# Patient Record
Sex: Male | Born: 1945 | Race: White | Hispanic: No | Marital: Married | Smoking: Former smoker
Health system: Southern US, Community
[De-identification: ages and names within clinical notes are randomized; demographics above are authoritative.]

## PROBLEM LIST (undated history)

## (undated) DIAGNOSIS — I50813 Acute on chronic right heart failure: Secondary | ICD-10-CM

## (undated) DIAGNOSIS — J449 Chronic obstructive pulmonary disease, unspecified: Secondary | ICD-10-CM

## (undated) DIAGNOSIS — R0682 Tachypnea, not elsewhere classified: Secondary | ICD-10-CM

## (undated) DIAGNOSIS — I2510 Atherosclerotic heart disease of native coronary artery without angina pectoris: Secondary | ICD-10-CM

## (undated) DIAGNOSIS — E559 Vitamin D deficiency, unspecified: Secondary | ICD-10-CM

## (undated) DIAGNOSIS — J301 Allergic rhinitis due to pollen: Secondary | ICD-10-CM

## (undated) DIAGNOSIS — I4891 Unspecified atrial fibrillation: Secondary | ICD-10-CM

## (undated) DIAGNOSIS — I255 Ischemic cardiomyopathy: Secondary | ICD-10-CM

## (undated) DIAGNOSIS — D638 Anemia in other chronic diseases classified elsewhere: Secondary | ICD-10-CM

---

## 2020-09-17 ENCOUNTER — Emergency Department (HOSPITAL_COMMUNITY): Payer: Medicare Other

## 2020-09-17 ENCOUNTER — Other Ambulatory Visit: Payer: Self-pay

## 2020-09-17 ENCOUNTER — Inpatient Hospital Stay (HOSPITAL_COMMUNITY)
Admission: EM | Admit: 2020-09-17 | Discharge: 2020-10-01 | DRG: 291 | Disposition: A | Discharge status: Hospice | Payer: Medicare Other | Attending: Internal Medicine | Admitting: Internal Medicine

## 2020-09-17 ENCOUNTER — Encounter (HOSPITAL_COMMUNITY): Payer: Self-pay | Admitting: Emergency Medicine

## 2020-09-17 DIAGNOSIS — E876 Hypokalemia: Secondary | ICD-10-CM | POA: Diagnosis not present

## 2020-09-17 DIAGNOSIS — J9621 Acute and chronic respiratory failure with hypoxia: Secondary | ICD-10-CM | POA: Diagnosis present

## 2020-09-17 DIAGNOSIS — I4819 Other persistent atrial fibrillation: Secondary | ICD-10-CM | POA: Diagnosis present

## 2020-09-17 DIAGNOSIS — R778 Other specified abnormalities of plasma proteins: Secondary | ICD-10-CM | POA: Diagnosis not present

## 2020-09-17 DIAGNOSIS — I248 Other forms of acute ischemic heart disease: Secondary | ICD-10-CM | POA: Diagnosis present

## 2020-09-17 DIAGNOSIS — I2510 Atherosclerotic heart disease of native coronary artery without angina pectoris: Secondary | ICD-10-CM | POA: Diagnosis present

## 2020-09-17 DIAGNOSIS — I130 Hypertensive heart and chronic kidney disease with heart failure and stage 1 through stage 4 chronic kidney disease, or unspecified chronic kidney disease: Secondary | ICD-10-CM | POA: Diagnosis present

## 2020-09-17 DIAGNOSIS — R0602 Shortness of breath: Secondary | ICD-10-CM | POA: Diagnosis not present

## 2020-09-17 DIAGNOSIS — E1122 Type 2 diabetes mellitus with diabetic chronic kidney disease: Secondary | ICD-10-CM | POA: Diagnosis present

## 2020-09-17 DIAGNOSIS — I5043 Acute on chronic combined systolic (congestive) and diastolic (congestive) heart failure: Secondary | ICD-10-CM | POA: Diagnosis present

## 2020-09-17 DIAGNOSIS — Z9981 Dependence on supplemental oxygen: Secondary | ICD-10-CM

## 2020-09-17 DIAGNOSIS — J9611 Chronic respiratory failure with hypoxia: Secondary | ICD-10-CM | POA: Diagnosis not present

## 2020-09-17 DIAGNOSIS — J189 Pneumonia, unspecified organism: Secondary | ICD-10-CM | POA: Diagnosis present

## 2020-09-17 DIAGNOSIS — N179 Acute kidney failure, unspecified: Secondary | ICD-10-CM | POA: Diagnosis present

## 2020-09-17 DIAGNOSIS — Z955 Presence of coronary angioplasty implant and graft: Secondary | ICD-10-CM

## 2020-09-17 DIAGNOSIS — I480 Paroxysmal atrial fibrillation: Secondary | ICD-10-CM | POA: Diagnosis not present

## 2020-09-17 DIAGNOSIS — J441 Chronic obstructive pulmonary disease with (acute) exacerbation: Secondary | ICD-10-CM | POA: Diagnosis present

## 2020-09-17 DIAGNOSIS — E559 Vitamin D deficiency, unspecified: Secondary | ICD-10-CM | POA: Diagnosis present

## 2020-09-17 DIAGNOSIS — Z87891 Personal history of nicotine dependence: Secondary | ICD-10-CM

## 2020-09-17 DIAGNOSIS — R059 Cough, unspecified: Secondary | ICD-10-CM | POA: Diagnosis present

## 2020-09-17 DIAGNOSIS — K649 Unspecified hemorrhoids: Secondary | ICD-10-CM | POA: Diagnosis present

## 2020-09-17 DIAGNOSIS — E785 Hyperlipidemia, unspecified: Secondary | ICD-10-CM | POA: Diagnosis present

## 2020-09-17 DIAGNOSIS — Z20822 Contact with and (suspected) exposure to covid-19: Secondary | ICD-10-CM | POA: Diagnosis present

## 2020-09-17 DIAGNOSIS — I4891 Unspecified atrial fibrillation: Secondary | ICD-10-CM

## 2020-09-17 DIAGNOSIS — Z7901 Long term (current) use of anticoagulants: Secondary | ICD-10-CM

## 2020-09-17 DIAGNOSIS — Z7189 Other specified counseling: Secondary | ICD-10-CM | POA: Diagnosis not present

## 2020-09-17 DIAGNOSIS — E1165 Type 2 diabetes mellitus with hyperglycemia: Secondary | ICD-10-CM | POA: Diagnosis present

## 2020-09-17 DIAGNOSIS — Z79899 Other long term (current) drug therapy: Secondary | ICD-10-CM

## 2020-09-17 DIAGNOSIS — Z6836 Body mass index (BMI) 36.0-36.9, adult: Secondary | ICD-10-CM | POA: Diagnosis not present

## 2020-09-17 DIAGNOSIS — I472 Ventricular tachycardia: Secondary | ICD-10-CM | POA: Diagnosis present

## 2020-09-17 DIAGNOSIS — I4510 Unspecified right bundle-branch block: Secondary | ICD-10-CM | POA: Diagnosis present

## 2020-09-17 DIAGNOSIS — J440 Chronic obstructive pulmonary disease with acute lower respiratory infection: Secondary | ICD-10-CM | POA: Diagnosis present

## 2020-09-17 DIAGNOSIS — Z8249 Family history of ischemic heart disease and other diseases of the circulatory system: Secondary | ICD-10-CM

## 2020-09-17 DIAGNOSIS — N1831 Chronic kidney disease, stage 3a: Secondary | ICD-10-CM | POA: Diagnosis present

## 2020-09-17 DIAGNOSIS — I5082 Biventricular heart failure: Secondary | ICD-10-CM | POA: Diagnosis present

## 2020-09-17 DIAGNOSIS — K219 Gastro-esophageal reflux disease without esophagitis: Secondary | ICD-10-CM | POA: Diagnosis present

## 2020-09-17 DIAGNOSIS — I10 Essential (primary) hypertension: Secondary | ICD-10-CM | POA: Diagnosis not present

## 2020-09-17 DIAGNOSIS — R339 Retention of urine, unspecified: Secondary | ICD-10-CM | POA: Diagnosis present

## 2020-09-17 DIAGNOSIS — N3289 Other specified disorders of bladder: Secondary | ICD-10-CM | POA: Diagnosis present

## 2020-09-17 DIAGNOSIS — Z7984 Long term (current) use of oral hypoglycemic drugs: Secondary | ICD-10-CM

## 2020-09-17 DIAGNOSIS — Z7951 Long term (current) use of inhaled steroids: Secondary | ICD-10-CM

## 2020-09-17 DIAGNOSIS — I5023 Acute on chronic systolic (congestive) heart failure: Secondary | ICD-10-CM | POA: Diagnosis not present

## 2020-09-17 DIAGNOSIS — R29810 Facial weakness: Secondary | ICD-10-CM | POA: Diagnosis not present

## 2020-09-17 DIAGNOSIS — Z66 Do not resuscitate: Secondary | ICD-10-CM | POA: Diagnosis present

## 2020-09-17 DIAGNOSIS — G4733 Obstructive sleep apnea (adult) (pediatric): Secondary | ICD-10-CM | POA: Diagnosis present

## 2020-09-17 DIAGNOSIS — K5900 Constipation, unspecified: Secondary | ICD-10-CM | POA: Diagnosis present

## 2020-09-17 DIAGNOSIS — I509 Heart failure, unspecified: Secondary | ICD-10-CM

## 2020-09-17 DIAGNOSIS — Z7902 Long term (current) use of antithrombotics/antiplatelets: Secondary | ICD-10-CM

## 2020-09-17 DIAGNOSIS — T380X5A Adverse effect of glucocorticoids and synthetic analogues, initial encounter: Secondary | ICD-10-CM | POA: Diagnosis present

## 2020-09-17 DIAGNOSIS — Z885 Allergy status to narcotic agent status: Secondary | ICD-10-CM

## 2020-09-17 DIAGNOSIS — Z7982 Long term (current) use of aspirin: Secondary | ICD-10-CM

## 2020-09-17 DIAGNOSIS — I5033 Acute on chronic diastolic (congestive) heart failure: Secondary | ICD-10-CM | POA: Diagnosis not present

## 2020-09-17 DIAGNOSIS — Z888 Allergy status to other drugs, medicaments and biological substances status: Secondary | ICD-10-CM

## 2020-09-17 DIAGNOSIS — Z515 Encounter for palliative care: Secondary | ICD-10-CM | POA: Diagnosis not present

## 2020-09-17 HISTORY — DX: Tachypnea, not elsewhere classified: R06.82

## 2020-09-17 HISTORY — DX: Ischemic cardiomyopathy: I25.5

## 2020-09-17 HISTORY — DX: Chronic obstructive pulmonary disease, unspecified: J44.9

## 2020-09-17 HISTORY — DX: Unspecified atrial fibrillation: I48.91

## 2020-09-17 HISTORY — DX: Atherosclerotic heart disease of native coronary artery without angina pectoris: I25.10

## 2020-09-17 HISTORY — DX: Allergic rhinitis due to pollen: J30.1

## 2020-09-17 HISTORY — DX: Acute on chronic right heart failure: I50.813

## 2020-09-17 HISTORY — DX: Vitamin D deficiency, unspecified: E55.9

## 2020-09-17 HISTORY — DX: Anemia in other chronic diseases classified elsewhere: D63.8

## 2020-09-17 LAB — COMPREHENSIVE METABOLIC PANEL
ALT: 23 U/L (ref 0–44)
AST: 37 U/L (ref 15–41)
Albumin: 3.8 g/dL (ref 3.5–5.0)
Alkaline Phosphatase: 67 U/L (ref 38–126)
Anion gap: 9 (ref 5–15)
BUN: 25 mg/dL — ABNORMAL HIGH (ref 8–23)
CO2: 28 mmol/L (ref 22–32)
Calcium: 8.2 mg/dL — ABNORMAL LOW (ref 8.9–10.3)
Chloride: 97 mmol/L — ABNORMAL LOW (ref 98–111)
Creatinine, Ser: 1.21 mg/dL (ref 0.61–1.24)
GFR, Estimated: >60 mL/min (ref >60)
Glucose, Bld: 132 mg/dL — ABNORMAL HIGH (ref 70–99)
Potassium: 4.5 mmol/L (ref 3.5–5.1)
Sodium: 134 mmol/L — ABNORMAL LOW (ref 135–145)
Total Bilirubin: 1.5 mg/dL — ABNORMAL HIGH (ref 0.3–1.2)
Total Protein: 7.3 g/dL (ref 6.5–8.1)

## 2020-09-17 LAB — CBC WITH DIFFERENTIAL/PLATELET
Abs Immature Granulocytes: 0.05 10*3/uL (ref 0.00–0.07)
Basophils Absolute: 0.1 10*3/uL (ref 0.0–0.1)
Basophils Relative: 1 %
Eosinophils Absolute: 0.1 10*3/uL (ref 0.0–0.5)
Eosinophils Relative: 1 %
HCT: 39.2 % (ref 39.0–52.0)
Hemoglobin: 12.5 g/dL — ABNORMAL LOW (ref 13.0–17.0)
Immature Granulocytes: 0 %
Lymphocytes Relative: 22 %
Lymphs Abs: 3.3 10*3/uL (ref 0.7–4.0)
MCH: 28.7 pg (ref 26.0–34.0)
MCHC: 31.9 g/dL (ref 30.0–36.0)
MCV: 89.9 fL (ref 80.0–100.0)
Monocytes Absolute: 1.3 10*3/uL — ABNORMAL HIGH (ref 0.1–1.0)
Monocytes Relative: 9 %
Neutro Abs: 9.9 10*3/uL — ABNORMAL HIGH (ref 1.7–7.7)
Neutrophils Relative %: 67 %
Platelets: 350 10*3/uL (ref 150–400)
RBC: 4.36 MIL/uL (ref 4.22–5.81)
RDW: 14 % (ref 11.5–15.5)
WBC: 14.8 10*3/uL — ABNORMAL HIGH (ref 4.0–10.5)
nRBC: 0 % (ref 0.0–0.2)

## 2020-09-17 LAB — RESP PANEL BY RT-PCR (FLU A&B, COVID) ARPGX2
Influenza A by PCR: NEGATIVE
Influenza B by PCR: NEGATIVE
SARS Coronavirus 2 by RT PCR: NEGATIVE

## 2020-09-17 LAB — LACTIC ACID, PLASMA
Lactic Acid, Venous: 1.6 mmol/L (ref 0.5–1.9)
Lactic Acid, Venous: 1.3 mmol/L (ref 0.5–1.9)

## 2020-09-17 LAB — PROTIME-INR
INR: 1.1 (ref 0.8–1.2)
Prothrombin Time: 14.6 seconds (ref 11.4–15.2)

## 2020-09-17 LAB — MAGNESIUM: Magnesium: 1.2 mg/dL — ABNORMAL LOW (ref 1.7–2.4)

## 2020-09-17 LAB — HEMOGLOBIN A1C
Hgb A1c MFr Bld: 6.3 % — ABNORMAL HIGH (ref 4.8–5.6)
Mean Plasma Glucose: 134.11 mg/dL

## 2020-09-17 LAB — TROPONIN I (HIGH SENSITIVITY)
Troponin I (High Sensitivity): 35 ng/L — ABNORMAL HIGH (ref <18)
Troponin I (High Sensitivity): 31 ng/L — ABNORMAL HIGH (ref <18)

## 2020-09-17 LAB — CBG MONITORING, ED
Glucose-Capillary: 129 mg/dL — ABNORMAL HIGH (ref 70–99)
Glucose-Capillary: 155 mg/dL — ABNORMAL HIGH (ref 70–99)

## 2020-09-17 LAB — BRAIN NATRIURETIC PEPTIDE: B Natriuretic Peptide: 445 pg/mL — ABNORMAL HIGH (ref 0.0–100.0)

## 2020-09-17 MED ORDER — IPRATROPIUM-ALBUTEROL 0.5-2.5 (3) MG/3ML IN SOLN
3.0000 mL | Freq: Four times a day (QID) | RESPIRATORY_TRACT | Status: DC
  Administered 2020-09-17 – 2020-09-26 (×35): 3 mL via RESPIRATORY_TRACT
  Filled 2020-09-17 (×34): qty 3

## 2020-09-17 MED ORDER — IPRATROPIUM-ALBUTEROL 0.5-2.5 (3) MG/3ML IN SOLN
3.0000 mL | RESPIRATORY_TRACT | Status: DC | PRN
  Administered 2020-09-21: 3 mL via RESPIRATORY_TRACT
  Filled 2020-09-17 (×2): qty 3

## 2020-09-17 MED ORDER — DILTIAZEM LOAD VIA INFUSION
10.0000 mg | Freq: Once | INTRAVENOUS | Status: AC
  Administered 2020-09-17: 10 mg via INTRAVENOUS
  Filled 2020-09-17: qty 10

## 2020-09-17 MED ORDER — ATORVASTATIN CALCIUM 40 MG PO TABS
40.0000 mg | ORAL_TABLET | Freq: Every day | ORAL | Status: DC
  Administered 2020-09-18 – 2020-10-01 (×14): 40 mg via ORAL
  Filled 2020-09-17 (×14): qty 1

## 2020-09-17 MED ORDER — PANTOPRAZOLE SODIUM 40 MG PO TBEC
40.0000 mg | DELAYED_RELEASE_TABLET | Freq: Every day | ORAL | Status: DC
  Administered 2020-09-18 – 2020-10-01 (×14): 40 mg via ORAL
  Filled 2020-09-17 (×15): qty 1

## 2020-09-17 MED ORDER — IPRATROPIUM-ALBUTEROL 20-100 MCG/ACT IN AERS: 1.0000 | INHALATION_SPRAY | Freq: Four times a day (QID) | RESPIRATORY_TRACT | Status: DC

## 2020-09-17 MED ORDER — CLOPIDOGREL BISULFATE 75 MG PO TABS
75.0000 mg | ORAL_TABLET | Freq: Every day | ORAL | Status: DC
  Administered 2020-09-18 – 2020-10-01 (×14): 75 mg via ORAL
  Filled 2020-09-17 (×14): qty 1

## 2020-09-17 MED ORDER — ADULT MULTIVITAMIN W/MINERALS CH
1.0000 | ORAL_TABLET | Freq: Every day | ORAL | Status: DC
  Administered 2020-09-18 – 2020-10-01 (×14): 1 via ORAL
  Filled 2020-09-17 (×14): qty 1

## 2020-09-17 MED ORDER — ACETAMINOPHEN 325 MG PO TABS
650.0000 mg | ORAL_TABLET | Freq: Four times a day (QID) | ORAL | Status: DC | PRN
  Administered 2020-09-20 – 2020-10-01 (×3): 650 mg via ORAL
  Filled 2020-09-17 (×4): qty 2

## 2020-09-17 MED ORDER — METOPROLOL TARTRATE 5 MG/5ML IV SOLN
5.0000 mg | Freq: Once | INTRAVENOUS | Status: AC
  Filled 2020-09-17: qty 5

## 2020-09-17 MED ORDER — INSULIN ASPART 100 UNIT/ML IJ SOLN
0.0000 [IU] | Freq: Three times a day (TID) | INTRAMUSCULAR | Status: DC
  Administered 2020-09-18 (×3): 3 [IU] via SUBCUTANEOUS
  Administered 2020-09-19: 2 [IU] via SUBCUTANEOUS
  Administered 2020-09-19 (×2): 3 [IU] via SUBCUTANEOUS
  Administered 2020-09-20: 5 [IU] via SUBCUTANEOUS
  Administered 2020-09-20: 3 [IU] via SUBCUTANEOUS
  Administered 2020-09-20 – 2020-09-21 (×2): 2 [IU] via SUBCUTANEOUS
  Administered 2020-09-21: 3 [IU] via SUBCUTANEOUS
  Administered 2020-09-21: 2 [IU] via SUBCUTANEOUS
  Administered 2020-09-22: 5 [IU] via SUBCUTANEOUS
  Administered 2020-09-22 – 2020-09-23 (×2): 2 [IU] via SUBCUTANEOUS
  Administered 2020-09-23 (×2): 3 [IU] via SUBCUTANEOUS
  Administered 2020-09-24: 2 [IU] via SUBCUTANEOUS
  Administered 2020-09-24: 5 [IU] via SUBCUTANEOUS
  Administered 2020-09-24: 3 [IU] via SUBCUTANEOUS
  Administered 2020-09-25: 8 [IU] via SUBCUTANEOUS
  Administered 2020-09-26 – 2020-09-27 (×3): 3 [IU] via SUBCUTANEOUS
  Administered 2020-09-27 – 2020-09-29 (×5): 2 [IU] via SUBCUTANEOUS
  Administered 2020-09-30: 3 [IU] via SUBCUTANEOUS
  Administered 2020-10-01 (×2): 2 [IU] via SUBCUTANEOUS

## 2020-09-17 MED ORDER — ASPIRIN EC 81 MG PO TBEC
81.0000 mg | DELAYED_RELEASE_TABLET | Freq: Every day | ORAL | Status: DC
  Administered 2020-09-18: 81 mg via ORAL
  Filled 2020-09-17: qty 1

## 2020-09-17 MED ORDER — GABAPENTIN 300 MG PO CAPS
300.0000 mg | ORAL_CAPSULE | Freq: Three times a day (TID) | ORAL | Status: DC
  Administered 2020-09-17 – 2020-10-01 (×43): 300 mg via ORAL
  Filled 2020-09-17 (×43): qty 1

## 2020-09-17 MED ORDER — LEVOFLOXACIN IN D5W 500 MG/100ML IV SOLN
500.0000 mg | Freq: Once | INTRAVENOUS | Status: AC
  Administered 2020-09-17: 500 mg via INTRAVENOUS
  Filled 2020-09-17: qty 100

## 2020-09-17 MED ORDER — METOPROLOL TARTRATE 5 MG/5ML IV SOLN
INTRAVENOUS | Status: AC
  Administered 2020-09-17: 5 mg via INTRAVENOUS
  Filled 2020-09-17: qty 5

## 2020-09-17 MED ORDER — VITAMIN D 25 MCG (1000 UNIT) PO TABS
2000.0000 [IU] | ORAL_TABLET | Freq: Every day | ORAL | Status: DC
  Administered 2020-09-18 – 2020-10-01 (×14): 2000 [IU] via ORAL
  Filled 2020-09-17 (×14): qty 2

## 2020-09-17 MED ORDER — ENOXAPARIN SODIUM 40 MG/0.4ML IJ SOSY
40.0000 mg | PREFILLED_SYRINGE | INTRAMUSCULAR | Status: DC
  Administered 2020-09-17: 40 mg via SUBCUTANEOUS
  Filled 2020-09-17: qty 0.4

## 2020-09-17 MED ORDER — METHYLPREDNISOLONE SODIUM SUCC 125 MG IJ SOLR
125.0000 mg | Freq: Once | INTRAMUSCULAR | Status: AC
  Administered 2020-09-17: 125 mg via INTRAVENOUS
  Filled 2020-09-17: qty 2

## 2020-09-17 MED ORDER — FUROSEMIDE 10 MG/ML IJ SOLN
40.0000 mg | Freq: Two times a day (BID) | INTRAMUSCULAR | Status: DC
  Administered 2020-09-17 – 2020-09-19 (×4): 40 mg via INTRAVENOUS
  Filled 2020-09-17 (×5): qty 4

## 2020-09-17 MED ORDER — MOMETASONE FURO-FORMOTEROL FUM 200-5 MCG/ACT IN AERO
2.0000 | INHALATION_SPRAY | Freq: Two times a day (BID) | RESPIRATORY_TRACT | Status: DC
  Administered 2020-09-17 – 2020-09-26 (×16): 2 via RESPIRATORY_TRACT
  Filled 2020-09-17 (×2): qty 8.8

## 2020-09-17 MED ORDER — SODIUM CHLORIDE 0.9 % IV BOLUS
500.0000 mL | Freq: Once | INTRAVENOUS | Status: AC
Start: 2020-09-17 — End: 2020-09-17
  Administered 2020-09-17: 500 mL via INTRAVENOUS

## 2020-09-17 MED ORDER — NITROGLYCERIN 0.4 MG SL SUBL: 0.4000 mg | SUBLINGUAL_TABLET | SUBLINGUAL | Status: DC | PRN

## 2020-09-17 MED ORDER — DILTIAZEM HCL 25 MG/5ML IV SOLN
10.0000 mg | Freq: Once | INTRAVENOUS | Status: AC
  Administered 2020-09-17: 10 mg via INTRAVENOUS

## 2020-09-17 MED ORDER — DILTIAZEM HCL-DEXTROSE 125-5 MG/125ML-% IV SOLN (PREMIX)
5.0000 mg/h | INTRAVENOUS | Status: DC
  Administered 2020-09-17: 12.5 mg/h via INTRAVENOUS
  Administered 2020-09-17: 5 mg/h via INTRAVENOUS
  Administered 2020-09-18: 12.5 mg/h via INTRAVENOUS
  Filled 2020-09-17 (×4): qty 125

## 2020-09-17 MED ORDER — MAGNESIUM SULFATE 2 GM/50ML IV SOLN
2.0000 g | Freq: Once | INTRAVENOUS | Status: AC
  Administered 2020-09-17: 2 g via INTRAVENOUS
  Filled 2020-09-17: qty 50

## 2020-09-17 MED ORDER — INSULIN ASPART 100 UNIT/ML IJ SOLN
0.0000 [IU] | Freq: Every day | INTRAMUSCULAR | Status: DC
  Administered 2020-09-24: 2 [IU] via SUBCUTANEOUS

## 2020-09-17 MED ORDER — DORZOLAMIDE HCL 2 % OP SOLN
1.0000 [drp] | Freq: Two times a day (BID) | OPHTHALMIC | Status: DC
  Administered 2020-09-18 – 2020-10-01 (×28): 1 [drp] via OPHTHALMIC
  Filled 2020-09-17 (×2): qty 10

## 2020-09-17 MED ORDER — ACETAMINOPHEN 650 MG RE SUPP: 650.0000 mg | Freq: Four times a day (QID) | RECTAL | Status: DC | PRN

## 2020-09-17 MED ORDER — METOPROLOL TARTRATE 25 MG PO TABS
25.0000 mg | ORAL_TABLET | Freq: Two times a day (BID) | ORAL | Status: DC
  Administered 2020-09-18: 25 mg via ORAL
  Filled 2020-09-17: qty 1

## 2020-09-17 MED ORDER — METHYLPREDNISOLONE SODIUM SUCC 40 MG IJ SOLR
40.0000 mg | Freq: Every day | INTRAMUSCULAR | Status: DC
  Administered 2020-09-18 – 2020-09-25 (×8): 40 mg via INTRAVENOUS
  Filled 2020-09-17 (×8): qty 1

## 2020-09-17 NOTE — ED Triage Notes (Signed)
Pt arrived by [facility]. Pt is from home. Pt c/o of feeling SOB> pt has Afib w/ RVR noted in traige. Berle Mull PA at bedside for [facility]

## 2020-09-17 NOTE — H&P (Signed)
History and Physical    Shahin Knierim BTD:[id number] DOB: [birth year] DOA: 09/17/2020  PCP: Smith Robert, MD  Patient coming from: Home  Chief Complaint: SOB  HPI: Dalton Juarez is a 75 y.o. male with medical history significant of copd on chronic 2L [place], DM2, chf with ischemic cardiomyopathy, afib who presented to the ED from home with 2-3 weeks of progressively worsening sob, swelling, and 10lb wt gain. Pt states being adherent to his medicaitons. States gradually had worsened tolerance to even walk few feet without becoming sob and has resorted to using his CPAP during the day to breathe. On the night prior to admit, pt reportedly slept with cpap disconnected. Pt woke up sob and despite increased O2 usage, continued to be sob, thus presented to ED for further w/u. Denies fevers, chills or sweats.  ED Course: In the ED, pt was noted to be tachycardic, found to be in Afib RVR on EKG. Pt was started on cardizem gtt. Pt was also found to have bilateral patchy opacities on CXR and was started on levaquin empirically. Hospitalist consulted for consideration for admission  Review of Systems:  Review of Systems  Constitutional:  Negative for chills, fever and weight loss.  HENT:  Negative for ear pain, nosebleeds and tinnitus.   Eyes:  Negative for double vision, photophobia and discharge.  Respiratory:  Positive for cough and shortness of breath. Negative for hemoptysis.   Cardiovascular:  Positive for orthopnea and leg swelling. Negative for chest pain.  Gastrointestinal:  Negative for abdominal pain, constipation, diarrhea and vomiting.  Genitourinary:  Negative for frequency, hematuria and urgency.  Musculoskeletal:  Negative for back pain, joint pain and neck pain.  Neurological:  Negative for tremors, sensory change, seizures and loss of consciousness.  Psychiatric/Behavioral:  Negative for hallucinations and memory loss.    Past Medical History:  Diagnosis Date   Acute on chronic  right-sided congestive heart failure (HCC)    Anemia in other chronic diseases classified elsewhere    Atrial fibrillation (HCC)    CAD (coronary artery disease)    COPD (chronic obstructive pulmonary disease) (HCC)    Ischemic cardiomyopathy    Seasonal allergic rhinitis due to pollen    Tachypnea    Vitamin D deficiency     History reviewed. No pertinent surgical history.   reports that he has quit smoking. His smoking use included cigarettes. He does not have any smokeless tobacco history on file. He reports that he does not currently use alcohol. He reports that he does not use drugs.  Allergies  Allergen Reactions   Advair Hfa [Fluticasone-Salmeterol]    Amiodarone Other (See Comments)    Bradycardia   Anoro Ellipta [Umeclidinium-Vilanterol]    Breo Ellipta [Fluticasone Furoate-Vilanterol]    Brovana [Arformoterol]    Budesonide    Entresto [Sacubitril-Valsartan]    Oxycodone Other (See Comments)    Required Narcan gtt after 10 of oxycodone for obtundation   Spiriva Respimat [Tiotropium Bromide Monohydrate]    Family hx Pt reports hx of leukemia in family Sister with "rare" kidney disease  Prior to Admission medications   Medication Sig Start Date End Date Taking? Authorizing Provider  albuterol (PROVENTIL) (2.5 MG/3ML) 0.083% nebulizer solution Take 2.5 mg by nebulization 3 (three) times daily. 07/11/20  Yes [provider]  albuterol (VENTOLIN HFA) 108 (90 Base) MCG/ACT inhaler Inhale into the lungs. 01/05/16  Yes [provider]  amoxicillin (AMOXIL) 500 MG capsule Take 500 mg by mouth 2 (two) times daily.  Yes [provider]  aspirin 81 MG EC tablet Take by mouth.   Yes [provider]  atorvastatin (LIPITOR) 40 MG tablet Take 40 mg by mouth daily as needed. 09/12/20  Yes [provider]  Cholecalciferol (VITAMIN D3) 50 MCG (2000 UT) TABS Take 1 tablet by mouth daily.   Yes [provider]  clopidogrel (PLAVIX) 75  MG tablet Take 75 mg by mouth daily. 09/12/20  Yes [provider]  dorzolamide (TRUSOPT) 2 % ophthalmic solution Place 1 drop into the left eye 2 (two) times daily. 09/14/20  Yes [provider]  furosemide (LASIX) 20 MG tablet Take 20 mg by mouth daily. 08/24/20  Yes [provider]  gabapentin (NEURONTIN) 300 MG capsule Take 300 mg by mouth 3 (three) times daily. 08/24/20  Yes [provider]  Ipratropium-Albuterol (COMBIVENT RESPIMAT) 20-100 MCG/ACT AERS respimat Inhale 1 puff into the lungs 4 (four) times daily.   Yes [provider]  metFORMIN (GLUCOPHAGE-XR) 500 MG 24 hr tablet Take 500 mg by mouth 2 (two) times daily. 08/24/20  Yes [provider]  metoprolol tartrate (LOPRESSOR) 25 MG tablet Take 25 mg by mouth in the morning, at noon, and at bedtime. 08/24/20  Yes [provider]  Multiple Vitamin (MULTI-VITAMIN) tablet Take 1 tablet by mouth daily.   Yes [provider]  nitroGLYCERIN (NITROSTAT) 0.4 MG SL tablet Place under the tongue. 08/24/20  Yes [provider]  omeprazole (PRILOSEC) 20 MG capsule Take 40 mg by mouth daily. 08/24/20  Yes [provider]  OXYGEN Inhale 2 L into the lungs daily.   Yes [provider]  SYMBICORT 160-4.5 MCG/ACT inhaler Inhale 2 puffs into the lungs 2 (two) times daily. 08/24/20  Yes [provider]  azithromycin (ZITHROMAX) 250 MG tablet Take 250 mg by mouth See admin instructions. Take 250 mg on Monday, Wednesday and Friday Patient not taking: No sig reported    [provider]  fluticasone (FLONASE) 50 MCG/ACT nasal spray Place 1 spray into both nostrils daily. Patient not taking: No sig reported    [provider]    Physical Exam: Vitals:   09/17/20 1430 09/17/20 1500 09/17/20 1525 09/17/20 1530  BP: 122/87 (!) 118/100  (!) 120/98  Pulse: (!) 113 (!) 105 (!) 111 95  Resp: (!) 21 (!) 22 (!) 26 (!) 24  Temp:      TempSrc:       SpO2: 100% 99% 100% 100%  Weight:      Height:        Constitutional: NAD, calm, comfortable Vitals:   09/17/20 1430 09/17/20 1500 09/17/20 1525 09/17/20 1530  BP: 122/87 (!) 118/100  (!) 120/98  Pulse: (!) 113 (!) 105 (!) 111 95  Resp: (!) 21 (!) 22 (!) 26 (!) 24  Temp:      TempSrc:      SpO2: 100% 99% 100% 100%  Weight:      Height:       Eyes: PERRL, lids and conjunctivae normal ENMT: Mucous membranes are moist. Bipap in place Neck: normal, supple, no masses Respiratory: decreased BS throughout, no audible wheezing, increased work of breathing, bipap in place Cardiovascular: tachycardic, irregularly irregular, s1, s2 Abdomen: no tenderness, no masses palpated. obese Musculoskeletal: No joint deformity upper and lower extremities, BLE pitting edema Skin: no rashes, lesions, ulcers. No induration Neurologic: CN 2-12 grossly intact. Sensation intact Psychiatric: Normal judgment and insight. Alert and oriented x 3. Normal mood.  Labs on Admission: I have personally reviewed following labs and imaging studies  CBC: Recent Labs  Lab 09/17/20 1314  WBC 14.8*  NEUTROABS 9.9*  HGB 12.5*  HCT 39.2  MCV 89.9  PLT 350   Basic Metabolic Panel: Recent Labs  Lab 09/17/20 1314  NA 134*  K 4.5  CL 97*  CO2 28  GLUCOSE 132*  BUN 25*  CREATININE 1.21  CALCIUM 8.2*  MG 1.2*   GFR: Estimated Creatinine Clearance: 57.6 mL/min (by C-G formula based on SCr of 1.21 mg/dL). Liver Function Tests: Recent Labs  Lab 09/17/20 1314  AST 37  ALT 23  ALKPHOS 67  BILITOT 1.5*  PROT 7.3  ALBUMIN 3.8   No results for input(s): LIPASE, AMYLASE in the last 168 hours. No results for input(s): AMMONIA in the last 168 hours. Coagulation Profile: Recent Labs  Lab 09/17/20 1314  INR 1.1   Cardiac Enzymes: No results for input(s): CKTOTAL, CKMB, CKMBINDEX, TROPONINI in the last 168 hours. BNP (last 3 results) No results for input(s): PROBNP in the last 8760  hours. HbA1C: No results for input(s): HGBA1C in the last 72 hours. CBG: No results for input(s): GLUCAP in the last 168 hours. Lipid Profile: No results for input(s): CHOL, HDL, LDLCALC, TRIG, CHOLHDL, LDLDIRECT in the last 72 hours. Thyroid Function Tests: No results for input(s): TSH, T4TOTAL, FREET4, T3FREE, THYROIDAB in the last 72 hours. Anemia Panel: No results for input(s): VITAMINB12, FOLATE, FERRITIN, TIBC, IRON, RETICCTPCT in the last 72 hours. Urine analysis: No results found for: COLORURINE, APPEARANCEUR, LABSPEC, PHURINE, GLUCOSEU, HGBUR, BILIRUBINUR, KETONESUR, PROTEINUR, UROBILINOGEN, NITRITE, LEUKOCYTESUR Sepsis Labs: !!!!!!!!!!!!!!!!!!!!!!!!!!!!!!!!!!!!!!!!!!!! @LABRCNTIP (procalcitonin:4,lacticidven:4) )No results found for this or any previous visit (from the past 240 hour(s)).   Radiological Exams on Admission: DG Chest Port 1 View  Result Date: 09/17/2020 CLINICAL DATA:  Shortness of breath, history of COPD and CHF EXAM: PORTABLE CHEST 1 VIEW COMPARISON:  None. FINDINGS: The heart is mildly enlarged. The mediastinal contours are within normal limits. There are patchy and reticular opacities throughout the right lung. There is a small right pleural effusion layering along the chest wall. The left lung is clear. There is no pneumothorax. There is no acute osseous abnormality. IMPRESSION: 1. Patchy opacities throughout the right lung with an associated small pleural effusion may reflect pneumonia in the correct clinical setting. Recommend follow-up radiographs in 6-8 weeks to assess for resolution. 2. Mild cardiomegaly. Electronically Signed   By: Lesia HausenPeter  Noone MD   On: 09/17/2020 13:53    EKG: Independently reviewed. Afib RVR  Assessment/Plan Principal Problem:   CHF exacerbation (HCC) Active Problems:   CAD (coronary artery disease)   Atrial fibrillation with RVR (HCC)   COPD with acute exacerbation (HCC)   Acutely decompensated CHF exacerbation, unclear if  systolic vs diastolic Presents with 2-3 week hx of gradually worsening sob, swelling, wt gain, worse activity tolerance Increased WOB with BLE pitting edema, elevated BNP of 445 On PO lasix prior to admit, will transition to 40mg  IV BID lasix Follow strict I/o and daily wts Recheck bmet in AM Afib RVR Presenting RVR noted on EKG Pt was started on cardizem gtt Resumed home metoprolol Pt not on anticoagulant prior to admit CAD Denies chest pain Mildly elevated troponin likely secondary to demand mismatch Follow serial troponin COPD No audible wheezing on exam Will continue on PRN duonebs Wean O2 as tolerated OSA on bipap Will order bipap for hospital use DM2 Continue on SSI coverage as needed Hold oral hyperglycemic meds  while in hospital Obesity Recommend diet/lifestyle modification Prior tobacco abuse Pt reports quitting 4 years ago, congratulated  DVT prophylaxis: Lovenox subq  Code Status: Full Family Communication: Pt in room, family not at bedside  Disposition Plan: Uncertain at this time  Consults called:  Admission status: Inpatient given patient's acute heart failure requiring IV lasix and bipap support for acute hypoxemic failure   Rickey Barbara MD [facility] Pager On Amion  If 7PM-7AM, please contact night-coverage  09/17/2020, 4:00 PM

## 2020-09-17 NOTE — ED Notes (Signed)
Call placed to patient's wife to update on change from transfer to [facility] to transfer to [facility]. Phone numbers for unit given to Wife.

## 2020-09-17 NOTE — ED Notes (Signed)
Attempted to take pt off bipap. Pt unable to be off bipap at this time. Pt became SOB and shallow breathing noted. Respiratory at bedside placing pt back on bi pap.

## 2020-09-17 NOTE — ED Notes (Signed)
Call placed to patient's wife to notify that the patient left with the [facility] transport team.

## 2020-09-17 NOTE — ED Notes (Addendum)
Transfer paperwork completed. Call placed to patient's wife to notify of pending transfer to [facility] hospital per patient's request Joanna Puff, [phone]).

## 2020-09-17 NOTE — ED Provider Notes (Signed)
[facility] EMERGENCY DEPARTMENT Provider Note   CSN: [id number] Arrival date & time: 09/17/20  1259     History Chief Complaint  Patient presents with   Tachycardia    Dalton Juarez is a 75 y.o. male.  Patient complains of shortness of breath.  Patient has a history of atrial fibs and congestive heart failure and COPD.  Patient has no chest pain  The history is provided by the patient and medical records. No language interpreter was used.  Shortness of Breath Severity:  Moderate Onset quality:  Sudden Timing:  Constant Progression:  Worsening Chronicity:  New Context: activity   Relieved by:  Nothing Worsened by:  Nothing Ineffective treatments:  None tried Associated symptoms: no abdominal pain, no chest pain, no cough, no headaches and no rash       Past Medical History:  Diagnosis Date   Acute on chronic right-sided congestive heart failure (HCC)    Anemia in other chronic diseases classified elsewhere    Atrial fibrillation (HCC)    CAD (coronary artery disease)    COPD (chronic obstructive pulmonary disease) (HCC)    Ischemic cardiomyopathy    Seasonal allergic rhinitis due to pollen    Tachypnea    Vitamin D deficiency     There are no problems to display for this patient.   History reviewed. No pertinent surgical history.     History reviewed. No pertinent family history.  Social History   Tobacco Use   Smoking status: Former    Types: Cigarettes  Substance Use Topics   Alcohol use: Not Currently   Drug use: Never    Home Medications Prior to Admission medications   Not on File    Allergies    Advair hfa [fluticasone-salmeterol], Budesonide, and Spiriva respimat [tiotropium bromide monohydrate]  Review of Systems   Review of Systems  Constitutional:  Negative for appetite change and fatigue.  HENT:  Negative for congestion, ear discharge and sinus pressure.   Eyes:  Negative for discharge.  Respiratory:  Positive for shortness of  breath. Negative for cough.   Cardiovascular:  Positive for palpitations. Negative for chest pain.  Gastrointestinal:  Negative for abdominal pain and diarrhea.  Genitourinary:  Negative for frequency and hematuria.  Musculoskeletal:  Negative for back pain.  Skin:  Negative for rash.  Neurological:  Negative for seizures and headaches.  Psychiatric/Behavioral:  Negative for hallucinations.    Physical Exam Updated Vital Signs BP 130/86   Pulse 98   Temp (!) 96.2 F (35.7 C) (Axillary)   Resp (!) 24   Ht 5\' 7"  (1.702 m)   Wt 99.8 kg   SpO2 100%   BMI 34.46 kg/m   Physical Exam Vitals reviewed.  Constitutional:      Appearance: He is well-developed. He is ill-appearing.  HENT:     Head: Normocephalic.     Nose: Nose normal.  Eyes:     General: No scleral icterus.    Conjunctiva/sclera: Conjunctivae normal.  Neck:     Thyroid: No thyromegaly.  Cardiovascular:     Rate and Rhythm: Tachycardia present. Rhythm irregular.     Heart sounds: No murmur heard.   No friction rub. No gallop.  Pulmonary:     Breath sounds: No stridor. No wheezing or rales.     Comments: Tachypneic Chest:     Chest wall: No tenderness.  Abdominal:     General: There is no distension.     Tenderness: There is no abdominal tenderness. There  is no rebound.  Musculoskeletal:        General: Normal range of motion.     Cervical back: Neck supple.  Lymphadenopathy:     Cervical: No cervical adenopathy.  Skin:    Findings: No erythema or rash.  Neurological:     Mental Status: He is alert and oriented to person, place, and time.     Motor: No abnormal muscle tone.     Coordination: Coordination normal.  Psychiatric:        Behavior: Behavior normal.    ED Results / Procedures / Treatments   Labs (all labs ordered are listed, but only abnormal results are displayed) Labs Reviewed  CBC WITH DIFFERENTIAL/PLATELET - Abnormal; Notable for the following components:      Result Value   WBC 14.8  (*)    Hemoglobin 12.5 (*)    Neutro Abs 9.9 (*)    Monocytes Absolute 1.3 (*)    All other components within normal limits  COMPREHENSIVE METABOLIC PANEL - Abnormal; Notable for the following components:   Sodium 134 (*)    Chloride 97 (*)    Glucose, Bld 132 (*)    BUN 25 (*)    Calcium 8.2 (*)    Total Bilirubin 1.5 (*)    All other components within normal limits  MAGNESIUM - Abnormal; Notable for the following components:   Magnesium 1.2 (*)    All other components within normal limits  BRAIN NATRIURETIC PEPTIDE - Abnormal; Notable for the following components:   B Natriuretic Peptide 445.0 (*)    All other components within normal limits  TROPONIN I (HIGH SENSITIVITY) - Abnormal; Notable for the following components:   Troponin I (High Sensitivity) 35 (*)    All other components within normal limits  RESP PANEL BY RT-PCR (FLU A&B, COVID) ARPGX2  LACTIC ACID, PLASMA  PROTIME-INR  LACTIC ACID, PLASMA  TROPONIN I (HIGH SENSITIVITY)    EKG None  Radiology DG Chest Port 1 View  Result Date: 09/17/2020 CLINICAL DATA:  Shortness of breath, history of COPD and CHF EXAM: PORTABLE CHEST 1 VIEW COMPARISON:  None. FINDINGS: The heart is mildly enlarged. The mediastinal contours are within normal limits. There are patchy and reticular opacities throughout the right lung. There is a small right pleural effusion layering along the chest wall. The left lung is clear. There is no pneumothorax. There is no acute osseous abnormality. IMPRESSION: 1. Patchy opacities throughout the right lung with an associated small pleural effusion may reflect pneumonia in the correct clinical setting. Recommend follow-up radiographs in 6-8 weeks to assess for resolution. 2. Mild cardiomegaly. Electronically Signed   By: Lesia Hausen MD   On: 09/17/2020 13:53    Procedures Procedures   Medications Ordered in ED Medications  diltiazem (CARDIZEM) 1 mg/mL load via infusion 10 mg (10 mg Intravenous Bolus  from Bag 09/17/20 1330)    And  diltiazem (CARDIZEM) 125 mg in dextrose 5% 125 mL (1 mg/mL) infusion (10 mg/hr Intravenous Infusion Verify 09/17/20 1406)  levofloxacin (LEVAQUIN) IVPB 500 mg (has no administration in time range)  methylPREDNISolone sodium succinate (SOLU-MEDROL) 125 mg/2 mL injection 125 mg (125 mg Intravenous Given 09/17/20 1323)  magnesium sulfate IVPB 2 g 50 mL (2 g Intravenous New Bag/Given 09/17/20 1325)  sodium chloride 0.9 % bolus 500 mL (500 mLs Intravenous Bolus 09/17/20 1325)  metoprolol tartrate (LOPRESSOR) injection 5 mg (5 mg Intravenous Given 09/17/20 1334)  diltiazem (CARDIZEM) injection 10 mg (10 mg Intravenous Bolus 09/17/20  1339)    ED Course  I have reviewed the triage vital signs and the nursing notes.  Pertinent labs & imaging results that were available during my care of the patient were reviewed by me and considered in my medical decision making (see chart for details). CRITICAL CARE Performed by: Bethann Berkshire Total critical care time: 40 minutes Critical care time was exclusive of separately billable procedures and treating other patients. Critical care was necessary to treat or prevent imminent or life-threatening deterioration. Critical care was time spent personally by me on the following activities: development of treatment plan with patient and/or surrogate as well as nursing, discussions with consultants, evaluation of patient's response to treatment, examination of patient, obtaining history from patient or surrogate, ordering and performing treatments and interventions, ordering and review of laboratory studies, ordering and review of radiographic studies, pulse oximetry and re-evaluation of patient's condition.    MDM Rules/Calculators/A&P                           Patient will be admitted for rapid atrial flutter.  He has improved a lot with Cardizem IV. Final Clinical Impression(s) / ED Diagnoses Final diagnoses:  Atrial fibrillation,  unspecified type [facility])    Rx / DC Orders ED Discharge Orders     None        Bethann Berkshire, MD 09/20/20 [phone]

## 2020-09-18 ENCOUNTER — Encounter (HOSPITAL_COMMUNITY): Payer: Self-pay | Admitting: Internal Medicine

## 2020-09-18 ENCOUNTER — Inpatient Hospital Stay (HOSPITAL_COMMUNITY): Payer: Medicare Other

## 2020-09-18 DIAGNOSIS — I480 Paroxysmal atrial fibrillation: Secondary | ICD-10-CM | POA: Diagnosis not present

## 2020-09-18 DIAGNOSIS — I5023 Acute on chronic systolic (congestive) heart failure: Secondary | ICD-10-CM | POA: Diagnosis not present

## 2020-09-18 DIAGNOSIS — I5043 Acute on chronic combined systolic (congestive) and diastolic (congestive) heart failure: Secondary | ICD-10-CM

## 2020-09-18 DIAGNOSIS — I2510 Atherosclerotic heart disease of native coronary artery without angina pectoris: Secondary | ICD-10-CM | POA: Diagnosis not present

## 2020-09-18 DIAGNOSIS — J9611 Chronic respiratory failure with hypoxia: Secondary | ICD-10-CM

## 2020-09-18 DIAGNOSIS — J441 Chronic obstructive pulmonary disease with (acute) exacerbation: Secondary | ICD-10-CM | POA: Diagnosis not present

## 2020-09-18 LAB — COMPREHENSIVE METABOLIC PANEL
ALT: 25 U/L (ref 0–44)
AST: 37 U/L (ref 15–41)
Albumin: 3.6 g/dL (ref 3.5–5.0)
Alkaline Phosphatase: 57 U/L (ref 38–126)
Anion gap: 15 (ref 5–15)
BUN: 28 mg/dL — ABNORMAL HIGH (ref 8–23)
CO2: 27 mmol/L (ref 22–32)
Calcium: 8.5 mg/dL — ABNORMAL LOW (ref 8.9–10.3)
Chloride: 97 mmol/L — ABNORMAL LOW (ref 98–111)
Creatinine, Ser: 1.28 mg/dL — ABNORMAL HIGH (ref 0.61–1.24)
GFR, Estimated: 58 mL/min — ABNORMAL LOW (ref >60)
Glucose, Bld: 151 mg/dL — ABNORMAL HIGH (ref 70–99)
Potassium: 4.6 mmol/L (ref 3.5–5.1)
Sodium: 139 mmol/L (ref 135–145)
Total Bilirubin: 1.1 mg/dL (ref 0.3–1.2)
Total Protein: 7.1 g/dL (ref 6.5–8.1)

## 2020-09-18 LAB — ECHOCARDIOGRAM COMPLETE
Area-P 1/2: 3.86 cm2
Calc EF: 41.4 %
Height: 67 in
MV M vel: 4.77 m/s
MV Peak grad: 91 mmHg
Radius: 0.4 cm
S' Lateral: 4.1 cm
Single Plane A2C EF: 34.9 %
Single Plane A4C EF: 44.6 %
Weight: 3686.09 oz

## 2020-09-18 LAB — CBC
HCT: 36.5 % — ABNORMAL LOW (ref 39.0–52.0)
HCT: 35.5 % — ABNORMAL LOW (ref 39.0–52.0)
Hemoglobin: 11.3 g/dL — ABNORMAL LOW (ref 13.0–17.0)
Hemoglobin: 11.1 g/dL — ABNORMAL LOW (ref 13.0–17.0)
MCH: 27.8 pg (ref 26.0–34.0)
MCH: 27.6 pg (ref 26.0–34.0)
MCHC: 31 g/dL (ref 30.0–36.0)
MCHC: 31.3 g/dL (ref 30.0–36.0)
MCV: 89.9 fL (ref 80.0–100.0)
MCV: 88.3 fL (ref 80.0–100.0)
Platelets: 319 10*3/uL (ref 150–400)
Platelets: 309 10*3/uL (ref 150–400)
RBC: 4.06 MIL/uL — ABNORMAL LOW (ref 4.22–5.81)
RBC: 4.02 MIL/uL — ABNORMAL LOW (ref 4.22–5.81)
RDW: 14 % (ref 11.5–15.5)
RDW: 14 % (ref 11.5–15.5)
WBC: 9.6 10*3/uL (ref 4.0–10.5)
WBC: 7.8 10*3/uL (ref 4.0–10.5)
nRBC: 0 % (ref 0.0–0.2)
nRBC: 0 % (ref 0.0–0.2)

## 2020-09-18 LAB — GLUCOSE, CAPILLARY
Glucose-Capillary: 156 mg/dL — ABNORMAL HIGH (ref 70–99)
Glucose-Capillary: 145 mg/dL — ABNORMAL HIGH (ref 70–99)
Glucose-Capillary: 159 mg/dL — ABNORMAL HIGH (ref 70–99)
Glucose-Capillary: 175 mg/dL — ABNORMAL HIGH (ref 70–99)
Glucose-Capillary: 180 mg/dL — ABNORMAL HIGH (ref 70–99)

## 2020-09-18 LAB — MRSA NEXT GEN BY PCR, NASAL: MRSA by PCR Next Gen: DETECTED — AB

## 2020-09-18 LAB — MAGNESIUM: Magnesium: 1.6 mg/dL — ABNORMAL LOW (ref 1.7–2.4)

## 2020-09-18 LAB — TSH: TSH: 0.797 u[IU]/mL (ref 0.350–4.500)

## 2020-09-18 LAB — PROCALCITONIN: Procalcitonin: <0.1 ng/mL

## 2020-09-18 MED ORDER — HEPARIN (PORCINE) 25000 UT/250ML-% IV SOLN
1100.0000 [IU]/h | INTRAVENOUS | Status: DC
  Administered 2020-09-18: 1300 [IU]/h via INTRAVENOUS
  Administered 2020-09-19: 1100 [IU]/h via INTRAVENOUS
  Filled 2020-09-18 (×2): qty 250

## 2020-09-18 MED ORDER — HEPARIN BOLUS VIA INFUSION
4000.0000 [IU] | Freq: Once | INTRAVENOUS | Status: AC
  Administered 2020-09-18: 4000 [IU] via INTRAVENOUS
  Filled 2020-09-18: qty 4000

## 2020-09-18 MED ORDER — MUPIROCIN 2 % EX OINT
1.0000 | TOPICAL_OINTMENT | Freq: Two times a day (BID) | CUTANEOUS | Status: AC
Start: 2020-09-18 — End: 2020-09-22
  Administered 2020-09-18 – 2020-09-22 (×10): 1 via NASAL
  Filled 2020-09-18: qty 22

## 2020-09-18 MED ORDER — MAGNESIUM SULFATE 2 GM/50ML IV SOLN
2.0000 g | Freq: Once | INTRAVENOUS | Status: AC
  Administered 2020-09-18: 2 g via INTRAVENOUS
  Filled 2020-09-18: qty 50

## 2020-09-18 MED ORDER — CHLORHEXIDINE GLUCONATE CLOTH 2 % EX PADS
6.0000 | MEDICATED_PAD | Freq: Every day | CUTANEOUS | Status: DC
  Administered 2020-09-18 – 2020-10-01 (×14): 6 via TOPICAL

## 2020-09-18 MED ORDER — CHLORHEXIDINE GLUCONATE 0.12 % MT SOLN
15.0000 mL | Freq: Two times a day (BID) | OROMUCOSAL | Status: DC
  Administered 2020-09-18 – 2020-10-01 (×28): 15 mL via OROMUCOSAL
  Filled 2020-09-18 (×26): qty 15

## 2020-09-18 MED ORDER — METOPROLOL TARTRATE 25 MG PO TABS
25.0000 mg | ORAL_TABLET | Freq: Three times a day (TID) | ORAL | Status: DC
  Administered 2020-09-18 – 2020-09-20 (×8): 25 mg via ORAL
  Filled 2020-09-18 (×8): qty 1

## 2020-09-18 MED ORDER — ORAL CARE MOUTH RINSE
15.0000 mL | Freq: Two times a day (BID) | OROMUCOSAL | Status: DC
  Administered 2020-09-18 – 2020-10-01 (×21): 15 mL via OROMUCOSAL

## 2020-09-18 NOTE — Progress Notes (Signed)
PROGRESS NOTE    Dalton Juarez  UEA:[id number] DOB: 1945/08/06 DOA: 09/17/2020 PCP: Smith RobertKikel, Stephen, MD   Chief Complaint  Patient presents with   Tachycardia   Brief Narrative: 75 year old male with COPD on chronic 2 L of cannula, T2DM, CHF/ischemic cardiomyopathy, atrial fibrillation presented to the ED with 2 to 3 weeks of progressively worsening shortness of breath, swelling, 10 pound weight gain, progressively worsening DOE and has been using CPAP during the daytime template.  Reportedly slept with CPAP disconnected on the night prior to admission and woke up short of breath despite increased oxygen use and presented to the ED In the ED tachycardic and alternating with RVR, placed on Cardizem drip, chest x-ray with bilateral patchy opacity placed on Levaquin, and BiPAP and [facility]  Subjective: He feels lees short of breath this am Off bipap now, on [place] at 6l [place] spo2 at 99%,HR in 110s on 10 of cardizem gtt,   Assessment & Plan:  Acute decompensation of chronic systolic CHF: Echo from [facility] in 03/15/16 showed 35%. Repeat echo pending.  Diuresing aggressively with IV Lasix.  On BiPAP repeat chest x-ray this morning- unchanged. Cardiology consulted.  Monitor electrolytes closely and renal function has creatinine slightly uptrending.  Monitor intake, output and daily weight as below Net IO Since Admission: -169.87 mL [09/18/20 1013]  Filed Weights   09/17/20 1303 09/18/20 0029 09/18/20 0422  Weight: 99.8 kg 104.5 kg 104.5 kg   Intake/Output Summary (Last 24 hours) at 09/18/2020 1013 Last data filed at 09/18/2020 0416 Gross per 24 hour  Intake 180.13 ml  Output 350 ml  Net -169.87 ml    Atrial fibrillation with RVR: On Cardizem drip. Not on AC at home.  Cardiology has been consulted  CAD: Without chest pain, troponin borderline elevated 35>31, no delta suspect type II demand mismatch.  Continue aspirin, Plavix and Lipitor 40 mg  COPD Chronic hypoxic  respiratory failure on 2 L [place]  at rest and 3l on ambulation: Acute with chronic hypoxic respiratory failure-needing bipap Multifactorial from CHF exacerbation and component of COPD exacerbation.  Patient is on steroid IV - transition to PO over the weekend, cont on bronchodilators/,supplemental oxygen  OSA on CPAP currently needing BiPAP  Type 2 diabetes: Well controlled HbA1c 6.3 09/17/20, holding p.o. meds, on sliding scale insulin Recent Labs  Lab 09/17/20 1833 09/17/20 2222 09/18/20 0818  GLUCAP 129* 155* 156*   Morbid obesity w/ BMI 36: Will benefit with weight loss, healthy lifestyle and PCP follow-up  Hypomagnesemia repleted on admission.  Repeating mag level  Leukocytosis/? possible pneumonia received antibiotics x1 dose in the ED.  I am checking procalcitonin antibiotics held off admission   Diet Order             Diet heart healthy/carb modified Room service appropriate? Yes; Fluid consistency: Thin  Diet effective now                   Patient's Body mass index is 36.08 kg/m.  DVT prophylaxis: enoxaparin (LOVENOX) injection 40 mg Start: 09/17/20 1800 Code Status:   Code Status: Full Code  Family Communication: plan of care discussed with patient at bedside. Status is: Inpatient Remains inpatient appropriate because:IV treatments appropriate due to intensity of illness or inability to take PO, Inpatient level of care appropriate due to severity of illness, and for ongoing IV diuresis for CHF treatment  Dispo: The patient is from: Home  Anticipated d/c is to: Home              Patient currently is not medically stable to d/c.   Difficult to place patient No Unresulted Labs (From admission, onward)     Start     Ordered   09/24/20 0500  Creatinine, serum  (enoxaparin (LOVENOX)    CrCl >/= 30 ml/min)  Weekly,   R     Comments: while on enoxaparin therapy    09/17/20 1548   09/18/20 0500  Basic metabolic panel  Daily,   R      09/17/20 1548            Medications reviewed:  Scheduled Meds:  aspirin EC  81 mg Oral Daily   atorvastatin  40 mg Oral Daily   chlorhexidine  15 mL Mouth Rinse BID   Chlorhexidine Gluconate Cloth  6 each Topical Daily   cholecalciferol  2,000 Units Oral Daily   clopidogrel  75 mg Oral Daily   dorzolamide  1 drop Left Eye BID   enoxaparin (LOVENOX) injection  40 mg Subcutaneous Q24H   furosemide  40 mg Intravenous BID   gabapentin  300 mg Oral TID   insulin aspart  0-15 Units Subcutaneous TID WC   insulin aspart  0-5 Units Subcutaneous QHS   ipratropium-albuterol  3 mL Nebulization Q6H   mouth rinse  15 mL Mouth Rinse q12n4p   methylPREDNISolone (SOLU-MEDROL) injection  40 mg Intravenous Daily   metoprolol tartrate  25 mg Oral BID WC   mometasone-formoterol  2 puff Inhalation BID   multivitamin with minerals  1 tablet Oral Daily   mupirocin ointment  1 application Nasal BID   pantoprazole  40 mg Oral Daily   Continuous Infusions:  diltiazem (CARDIZEM) infusion 10 mg/hr (09/18/20 0420)   Consultants:see note  Procedures:see note Antimicrobials: Anti-infectives (From admission, onward)    Start     Dose/Rate Route Frequency Ordered Stop   09/17/20 1415  levofloxacin (LEVAQUIN) IVPB 500 mg        500 mg 100 mL/hr over 60 Minutes Intravenous  Once 09/17/20 1400 09/17/20 1818      Culture/Microbiology No results found for: SDES, SPECREQUEST, CULT, REPTSTATUS  Other culture-see note  Objective: Vitals: Today's Vitals   09/18/20 0321 09/18/20 0400 09/18/20 0422 09/18/20 0736  BP:  115/67    Pulse: 87 91    Resp: 19 18    Temp:  98.1 F (36.7 C)  97.9 F (36.6 C)  TempSrc:  Axillary  Axillary  SpO2: 100% 99%    Weight:   104.5 kg   Height:      PainSc:  Asleep      Intake/Output Summary (Last 24 hours) at 09/18/2020 1013 Last data filed at 09/18/2020 0416 Gross per 24 hour  Intake 180.13 ml  Output 350 ml  Net -169.87 ml   Filed Weights   09/17/20 1303 09/18/20 0029  09/18/20 0422  Weight: 99.8 kg 104.5 kg 104.5 kg   Weight change:   Intake/Output from previous day: 08/11 0701 - 08/12 0700 In: 180.1 [I.V.:180.1] Out: 350 [Urine:350] Intake/Output this shift: No intake/output data recorded. Filed Weights   09/17/20 1303 09/18/20 0029 09/18/20 0422  Weight: 99.8 kg 104.5 kg 104.5 kg   Examination: General exam: AAO x3, obese,older than stated age, weak appearing. HEENT:Oral mucosa moist, Ear/Nose WNL grossly,dentition normal. Respiratory system: bilaterally diminished,no use of accessory muscle, non tender. Cardiovascular system: S1 & S2 +,irregularly irregular, No JVD. Gastrointestinal  system: Abdomen soft, NT,ND, BS+. Nervous System:Alert, awake, moving extremities Extremities: b/l LE edema++, distal peripheral pulses palpable.  Skin: No rashes,no icterus. MSK: Normal muscle bulk,tone, power Data Reviewed: I have personally reviewed following labs and imaging studies CBC: Recent Labs  Lab 09/17/20 1314 09/18/20 0312 09/18/20 0729  WBC 14.8* 9.6 7.8  NEUTROABS 9.9*  --   --   HGB 12.5* 11.3* 11.1*  HCT 39.2 36.5* 35.5*  MCV 89.9 89.9 88.3  PLT 350 319 309   Basic Metabolic Panel: Recent Labs  Lab 09/17/20 1314 09/18/20 0312  NA 134* 139  K 4.5 4.6  CL 97* 97*  CO2 28 27  GLUCOSE 132* 151*  BUN 25* 28*  CREATININE 1.21 1.28*  CALCIUM 8.2* 8.5*  MG 1.2*  --    GFR: Estimated Creatinine Clearance: 55.7 mL/min (A) (by C-G formula based on SCr of 1.28 mg/dL (H)). Liver Function Tests: Recent Labs  Lab 09/17/20 1314 09/18/20 0312  AST 37 37  ALT 23 25  ALKPHOS 67 57  BILITOT 1.5* 1.1  PROT 7.3 7.1  ALBUMIN 3.8 3.6   No results for input(s): LIPASE, AMYLASE in the last 168 hours. No results for input(s): AMMONIA in the last 168 hours. Coagulation Profile: Recent Labs  Lab 09/17/20 1314  INR 1.1   Cardiac Enzymes: No results for input(s): CKTOTAL, CKMB, CKMBINDEX, TROPONINI in the last 168 hours. BNP (last 3  results) No results for input(s): PROBNP in the last 8760 hours. HbA1C: Recent Labs    09/17/20 1314  HGBA1C 6.3*   CBG: Recent Labs  Lab 09/17/20 1833 09/17/20 2222 09/18/20 0818  GLUCAP 129* 155* 156*   Lipid Profile: No results for input(s): CHOL, HDL, LDLCALC, TRIG, CHOLHDL, LDLDIRECT in the last 72 hours. Thyroid Function Tests: No results for input(s): TSH, T4TOTAL, FREET4, T3FREE, THYROIDAB in the last 72 hours. Anemia Panel: No results for input(s): VITAMINB12, FOLATE, FERRITIN, TIBC, IRON, RETICCTPCT in the last 72 hours. Sepsis Labs: Recent Labs  Lab 09/17/20 1342 09/17/20 1522  LATICACIDVEN 1.6 1.3    Recent Results (from the past 240 hour(s))  Resp Panel by RT-PCR (Flu A&B, Covid) Nasopharyngeal Swab     Status: None   Collection Time: 09/17/20  6:00 PM   Specimen: Nasopharyngeal Swab; Nasopharyngeal(NP) swabs in vial transport medium  Result Value Ref Range Status   SARS Coronavirus 2 by RT PCR NEGATIVE NEGATIVE Final    Comment: (NOTE) SARS-CoV-2 target nucleic acids are NOT DETECTED.  The SARS-CoV-2 RNA is generally detectable in upper respiratory specimens during the acute phase of infection. The lowest concentration of SARS-CoV-2 viral copies this assay can detect is 138 copies/mL. A negative result does not preclude SARS-Cov-2 infection and should not be used as the sole basis for treatment or other patient management decisions. A negative result may occur with  improper specimen collection/handling, submission of specimen other than nasopharyngeal swab, presence of viral mutation(s) within the areas targeted by this assay, and inadequate number of viral copies(<138 copies/mL). A negative result must be combined with clinical observations, patient history, and epidemiological information. The expected result is Negative.  Fact Sheet for Patients:  BloggerCourse.com  Fact Sheet for Healthcare Providers:   SeriousBroker.it  This test is no t yet approved or cleared by the [place] FDA and  has been authorized for detection and/or diagnosis of SARS-CoV-2 by FDA under an Emergency Use Authorization (EUA). This EUA will remain  in effect (meaning this test can be used) for the duration  of the COVID-19 declaration under Section 564(b)(1) of the Act, 21 U.S.C.section 360bbb-3(b)(1), unless the authorization is terminated  or revoked sooner.       Influenza A by PCR NEGATIVE NEGATIVE Final   Influenza B by PCR NEGATIVE NEGATIVE Final    Comment: (NOTE) The Xpert Xpress SARS-CoV-2/FLU/RSV plus assay is intended as an aid in the diagnosis of influenza from Nasopharyngeal swab specimens and should not be used as a sole basis for treatment. Nasal washings and aspirates are unacceptable for Xpert Xpress SARS-CoV-2/FLU/RSV testing.  Fact Sheet for Patients: BloggerCourse.com  Fact Sheet for Healthcare Providers: SeriousBroker.it  This test is not yet approved or cleared by the [place] FDA and has been authorized for detection and/or diagnosis of SARS-CoV-2 by FDA under an Emergency Use Authorization (EUA). This EUA will remain in effect (meaning this test can be used) for the duration of the COVID-19 declaration under Section 564(b)(1) of the Act, 21 U.S.C. section 360bbb-3(b)(1), unless the authorization is terminated or revoked.  Performed at [facility], [address] [place], [place] [address]   MRSA Next Gen by PCR, Nasal     Status: Abnormal   Collection Time: 09/18/20 12:17 AM   Specimen: Nasal Mucosa; Nasal Swab  Result Value Ref Range Status   MRSA by PCR Next Gen DETECTED (A) NOT DETECTED Final    Comment: RESULT CALLED TO, READ BACK BY AND VERIFIED WITH: DANIELLE, RN @ [phone] ON 09/18/20 C VARNER (NOTE) The GeneXpert MRSA Assay (FDA approved for NASAL specimens only), is one component of a  comprehensive MRSA colonization surveillance program. It is not intended to diagnose MRSA infection nor to guide or monitor treatment for MRSA infections. Test performance is not FDA approved in patients less than 49 years old. Performed at [facility], [address] [place], [place] [address]     Radiology Studies: [facility] Chest Port 1 View  Result Date: 09/17/2020 CLINICAL DATA:  Shortness of breath, history of COPD and CHF EXAM: PORTABLE CHEST 1 VIEW COMPARISON:  None. FINDINGS: The heart is mildly enlarged. The mediastinal contours are within normal limits. There are patchy and reticular opacities throughout the right lung. There is a small right pleural effusion layering along the chest wall. The left lung is clear. There is no pneumothorax. There is no acute osseous abnormality. IMPRESSION: 1. Patchy opacities throughout the right lung with an associated small pleural effusion may reflect pneumonia in the correct clinical setting. Recommend follow-up radiographs in 6-8 weeks to assess for resolution. 2. Mild cardiomegaly. Electronically Signed   By: Lesia Hausen MD   On: 09/17/2020 13:53     LOS: 1 day   Lanae Boast, MD [facility]  09/18/2020, 10:13 AM

## 2020-09-18 NOTE — Progress Notes (Signed)
Received call from Ed Nurse that patient came in with COPD requesting Chaplain visit.  Found patient resting, on Bi-Pap. Engaged patient in conversation around his faith and Health history. His wife, Dalton Juarez has been his PCG and her health has been in decline as well. Code status was not mentioned today but this might be a conversation to have soon. He asked that Chaplain call his spouse, Dalton Juarez, and she asked that I relay to him that she loves him. Prayer focused around anxiety, peace, assurance in his faith and God's presence with him. Plans are for him to be moved to [facility]. I asked that nurse, Massie Bougie relay that information to his wife, Dalton Juarez.  Chaplain will remain available in order to provide spiritual support and to assess for spiritual need.

## 2020-09-18 NOTE — Progress Notes (Signed)
ANTICOAGULATION CONSULT NOTE - Initial Consult  Pharmacy Consult for heparin Indication: atrial fibrillation  Allergies  Allergen Reactions   Advair Hfa [Fluticasone-Salmeterol]    Amiodarone Other (See Comments)    Bradycardia   Anoro Ellipta [Umeclidinium-Vilanterol]    Breo Ellipta [Fluticasone Furoate-Vilanterol]    Brovana [Arformoterol]    Budesonide    Entresto [Sacubitril-Valsartan]    Oxycodone Other (See Comments)    Required Narcan gtt after 10 of oxycodone for obtundation   Spiriva Respimat [Tiotropium Bromide Monohydrate]     Patient Measurements: Height: 5\' 7"  (170.2 cm) Weight: 104.5 kg (230 lb 6.1 oz) IBW/kg (Calculated) : 66.1 Heparin Dosing Weight: 89 kg  Vital Signs: Temp: 97.9 F (36.6 C) (08/12 0736) Temp Source: Axillary (08/12 0736) BP: 115/67 (08/12 0400) Pulse Rate: 91 (08/12 0400)  Labs: Recent Labs    09/17/20 1314 09/17/20 1522 09/18/20 0312 09/18/20 0729  HGB 12.5*  --  11.3* 11.1*  HCT 39.2  --  36.5* 35.5*  PLT 350  --  319 309  LABPROT 14.6  --   --   --   INR 1.1  --   --   --   CREATININE 1.21  --  1.28*  --   TROPONINIHS 35* 31*  --   --     Estimated Creatinine Clearance: 55.7 mL/min (A) (by C-G formula based on SCr of 1.28 mg/dL (H)).   Medical History: Past Medical History:  Diagnosis Date   Acute on chronic right-sided congestive heart failure (HCC)    Anemia in other chronic diseases classified elsewhere    Atrial fibrillation (HCC)    CAD (coronary artery disease)    COPD (chronic obstructive pulmonary disease) (HCC)    Ischemic cardiomyopathy    Seasonal allergic rhinitis due to pollen    Tachypnea    Vitamin D deficiency     Medications: Pt is not on anticoagulants PTA  Assessment: Pt is a 89 yoM with PMH significant for CAD, HF, afib (not on anticoagulation PTA). Admitted with SOB/tachycardia found to be in afib with RVR - cardiology consulted. Pharmacy consulted to dose/monitor heparin drip.   Today,  09/18/20 CBC: Hgb (11.1) slightly low; Plt WNL Currently prescribed enoxaparin 40 mg subQ daily for DVT ppx (last dose given 8/11 @ 1856) SCr 1.28, CrCl 56 mL/min  Goal of Therapy:  Heparin level 0.3-0.7 units/ml Monitor platelets by anticoagulation protocol: Yes   Plan:  Discontinue enoxaparin Give 4000 units bolus x 1 Start heparin infusion at 1300 units/hr Check anti-Xa level in 8 hours and daily while on heparin Continue to monitor H&H and platelets  10/11, PharmD 09/18/2020,12:37 PM

## 2020-09-18 NOTE — Progress Notes (Signed)
  Echocardiogram 2D Echocardiogram has been performed.  Janalyn Harder 09/18/2020, 1:42 PM

## 2020-09-18 NOTE — Consult Note (Addendum)
Cardiology Consultation:   Patient ID: Dalton Juarez MRN: [id number]; DOB: [birth year]  Admit date: 09/17/2020 Date of Consult: 09/18/2020  PCP:  Vidal Schwalbe, MD   [facility] Providers Cardiologist: New   Patient Profile:   Dalton Juarez is a 75 y.o. male with a history of CAD, ischemic cardiomyopathy/chronic systolic CHF, paroxysmal atrial fibrillation not on anticoagulation at home, iatrogenic hemothorax from bedside thoracentesis in 03/2016 s/p VATS decortication with mechanical pleurodesis at [facility], COPD on home O2, hypertension, hyperlipidemia, type 2 diabetes, GERD, and prior tobacco use (quit 4 years ago) who is being seen 09/18/2020 for the evaluation of paroxysmal atrial fibrillation at the request of Dr. Wyline Copas.  History of Present Illness:   Dalton Juarez is a 75 year old male with the above history. He is followed by Dr. Carrolyn Leigh in [place], [place]. He states he was admitted at [facility] in 2015 or 2016 with CHF and was found to have CAD. He states he had 2 stents placed at that time. He was admitted to [facility] in 2018 and was found to have a right pleural effusion. He underwent a thoracentesis complicated by iatrogenic hemothorax requiring VATS decortication with mechanical pleurodesis. He reports he had some atrial fibrillation at this time but it does not look like he was started on any anticoagulation. He has been on Lopressor TID since then. He states that about 1 year, Dr. Gibson Juarez increased his Lopressor to 56m TID due to elevated rates but he had significant fatigue/decreased energy with this so it was dropped back down to 231mTID. He states he last saw Dr. LiGibson Rampbout 6 months ago.  [facility] via EMS for further evaluation of shortness of breath. He reports worsening/progressive dyspnea on exertion over the last couple of months to the point that he gets severely short of breath with basically any activity even ambulating short  distance. He does have underlying COPD and is usually on 2L of home O2 24/7. He states he has had to increase to 3L recently with not much improvement. He denies any shortness of breath. No orthopnea or PND. He reports abdominal bloating but has not noticed any lower extremity edema at home. No chest pain. No palpitations. He notes a couple of episodes of lightheadedness/dizziness but no syncope. He has a chronic cough but this is stable. No fevers or recent illness. No GI symptoms. No abnormal bleeding in urine or stools.  In the ED, EKG showed wide complex tachycardia, rate of 174, with irregular rhythm. Consistent with atrial fibrillation with underlying RBBB. High-sensitivity troponin minimally elevated and flat at 35 >> 31 consistent with demand ischemia. BNP elevated at 445. Chest x-ray showed mild cardiomegaly with patchy opacities throughout the right lung with an associated small pleural effusion which may reflect pneumonia. WBC 14.8, Hgb 12.5, Plts 350. Na 134, K 4.5, Glucose 132, BUN 25, Cr 1.21. Albumin 3.8, AST 37, ALT 23, Alk Phos 67, Total Bili 1.5. Magnesium 1.2. Lactic acid normal. Respiratory panel negative for COVID and influenza A/B. Patient started on IV Cardizem drip and admitted. Cardiology consulted for assistance.  At the time of this evaluation, patient completely unaware that he is in atrial fibrillation with RVR. No palpitations. No significant improvement in breathing with Lasix so far.  He has history of tobacco use but quit smoking about 4 years ago. No known family history of heart disease.   Past Medical History:  Diagnosis Date   Acute on chronic right-sided  congestive heart failure (HCC)    Anemia in other chronic diseases classified elsewhere    Atrial fibrillation (HCC)    CAD (coronary artery disease)    COPD (chronic obstructive pulmonary disease) ([place])    Ischemic cardiomyopathy    Seasonal allergic rhinitis due to pollen    Tachypnea    Vitamin D deficiency      History reviewed. No pertinent surgical history.   Home Medications:  Prior to Admission medications   Medication Sig Start Date End Date Taking? Authorizing Provider  albuterol (PROVENTIL) (2.5 MG/3ML) 0.083% nebulizer solution Take 2.5 mg by nebulization 3 (three) times daily. 07/11/20  Yes [provider]  albuterol (VENTOLIN HFA) 108 (90 Base) MCG/ACT inhaler Inhale into the lungs. 01/05/16  Yes [provider]  amoxicillin (AMOXIL) 500 MG capsule Take 500 mg by mouth 2 (two) times daily.   Yes [provider]  aspirin 81 MG EC tablet Take by mouth.   Yes [provider]  atorvastatin (LIPITOR) 40 MG tablet Take 40 mg by mouth daily as needed. 09/12/20  Yes [provider]  Cholecalciferol (VITAMIN D3) 50 MCG (2000 UT) TABS Take 1 tablet by mouth daily.   Yes [provider]  clopidogrel (PLAVIX) 75 MG tablet Take 75 mg by mouth daily. 09/12/20  Yes [provider]  dorzolamide (TRUSOPT) 2 % ophthalmic solution Place 1 drop into the left eye 2 (two) times daily. 09/14/20  Yes [provider]  furosemide (LASIX) 20 MG tablet Take 20 mg by mouth daily. 08/24/20  Yes [provider]  gabapentin (NEURONTIN) 300 MG capsule Take 300 mg by mouth 3 (three) times daily. 08/24/20  Yes [provider]  Ipratropium-Albuterol (COMBIVENT RESPIMAT) 20-100 MCG/ACT AERS respimat Inhale 1 puff into the lungs 4 (four) times daily.   Yes [provider]  metFORMIN (GLUCOPHAGE-XR) 500 MG 24 hr tablet Take 500 mg by mouth 2 (two) times daily. 08/24/20  Yes [provider]  metoprolol tartrate (LOPRESSOR) 25 MG tablet Take 25 mg by mouth in the morning, at noon, and at bedtime. 08/24/20  Yes [provider]  Multiple Vitamin (MULTI-VITAMIN) tablet Take 1 tablet by mouth daily.   Yes [provider]  nitroGLYCERIN (NITROSTAT) 0.4 MG SL tablet Place under the tongue. 08/24/20  Yes [provider]  omeprazole (PRILOSEC) 20 MG capsule Take 40 mg by mouth daily. 08/24/20  Yes [provider]  OXYGEN Inhale 2 L into the lungs daily.   Yes [provider]  SYMBICORT 160-4.5 MCG/ACT inhaler Inhale 2 puffs into the lungs 2 (two) times daily. 08/24/20  Yes [provider]  azithromycin (ZITHROMAX) 250 MG tablet Take 250 mg by mouth See admin instructions. Take 250 mg on Monday, Wednesday and Friday Patient not taking: No sig reported    [provider]  fluticasone (FLONASE) 50 MCG/ACT nasal spray Place 1 spray into both nostrils daily. Patient not taking: No sig reported    [provider]    Inpatient Medications: Scheduled Meds:  aspirin EC  81 mg Oral Daily   atorvastatin  40 mg Oral Daily   chlorhexidine  15 mL Mouth Rinse BID   Chlorhexidine Gluconate Cloth  6 each Topical Daily   cholecalciferol  2,000 Units Oral Daily   clopidogrel  75 mg Oral Daily   dorzolamide  1 drop Left Eye BID   enoxaparin (LOVENOX) injection  40 mg Subcutaneous Q24H   furosemide  40 mg Intravenous BID  gabapentin  300 mg Oral TID   insulin aspart  0-15 Units Subcutaneous TID WC   insulin aspart  0-5 Units Subcutaneous QHS   ipratropium-albuterol  3 mL Nebulization Q6H   mouth rinse  15 mL Mouth Rinse q12n4p   methylPREDNISolone (SOLU-MEDROL) injection  40 mg Intravenous Daily   metoprolol tartrate  25 mg Oral BID WC   mometasone-formoterol  2 puff Inhalation BID   multivitamin with minerals  1 tablet Oral Daily   mupirocin ointment  1 application Nasal BID   pantoprazole  40 mg Oral Daily   Continuous Infusions:  diltiazem (CARDIZEM) infusion 10 mg/hr (09/18/20 0420)   PRN Meds: acetaminophen **OR** acetaminophen, ipratropium-albuterol, nitroGLYCERIN  Allergies:    Allergies  Allergen Reactions   Advair Hfa [Fluticasone-Salmeterol]    Amiodarone Other (See Comments)    Bradycardia   Anoro Ellipta [Umeclidinium-Vilanterol]    Breo  Ellipta [Fluticasone Furoate-Vilanterol]    Brovana [Arformoterol]    Budesonide    Entresto [Sacubitril-Valsartan]    Oxycodone Other (See Comments)    Required Narcan gtt after 10 of oxycodone for obtundation   Spiriva Respimat [Tiotropium Bromide Monohydrate]     Social History:   Social History   Socioeconomic History   Marital status: Married    Spouse name: Not on file   Number of children: Not on file   Years of education: Not on file   Highest education level: Not on file  Occupational History   Not on file  Tobacco Use   Smoking status: Former    Types: Cigarettes   Smokeless tobacco: Not on file  Substance and Sexual Activity   Alcohol use: Not Currently   Drug use: Never   Sexual activity: Not on file  Other Topics Concern   Not on file  Social History Narrative   Not on file   Social Determinants of Health   Financial Resource Strain: Not on file  Food Insecurity: Not on file  Transportation Needs: Not on file  Physical Activity: Not on file  Stress: Not on file  Social Connections: Not on file  Intimate Partner Violence: Not on file    Family History:   Family History  Problem Relation Age of Onset   Hypertension Mother    CAD Neg Hx    Heart failure Neg Hx    Stroke Neg Hx      ROS:  Please see the history of present illness.  Review of Systems  Constitutional:  Negative for fever and malaise/fatigue.  HENT:  Negative for congestion.   Respiratory:  Positive for cough and shortness of breath.   Cardiovascular:  Positive for leg swelling. Negative for chest pain, palpitations, orthopnea and PND.  Gastrointestinal:  Negative for blood in stool, melena, nausea and vomiting.  Genitourinary:  Negative for hematuria.  Musculoskeletal:  Negative for myalgias.  Neurological:  Positive for dizziness. Negative for loss of consciousness.  Endo/Heme/Allergies:  Does not bruise/bleed easily.  Psychiatric/Behavioral:  Positive for substance abuse (prior  tobacco use).     Physical Exam/Data:   Vitals:   09/18/20 0400 09/18/20 0422 09/18/20 0736 09/18/20 1100  BP: 115/67     Pulse: 91     Resp: 18     Temp: 98.1 F (36.7 C)  97.9 F (36.6 C)   TempSrc: Axillary  Axillary   SpO2: 99%   97%  Weight:  104.5 kg    Height:        Intake/Output Summary (Last 24 hours) at  09/18/2020 1217 Last data filed at 09/18/2020 0416 Gross per 24 hour  Intake 180.13 ml  Output 350 ml  Net -169.87 ml   Last 3 Weights 09/18/2020 09/18/2020 09/17/2020  Weight (lbs) 230 lb 6.1 oz 230 lb 6.1 oz 220 lb  Weight (kg) 104.5 kg 104.5 kg 99.791 kg     Body mass index is 36.08 kg/m.  General: 75 y.o. obese Caucasian male resting comfortably in no acute distress. HEENT: Normocephalic and atraumatic. Sclera clear.  Neck: Supple. No JVD. Heart: Tachycardic with irregularly irregular rhythm. No murmurs, gallops, or rubs. Radial pulses 2+ and equal bilaterally. Lungs: No increased work of breathing. Decreased breath sounds throughout with mild crackles in bilateral bases. Abdomen: Soft, non-distended, and non-tender to palpation.  Extremities: 2+ pitting edema of bilateral lower extremities. Skin: Warm and dry. Neuro: Alert and oriented x3. No focal deficits. Psych: Normal affect. Responds appropriately.  EKG:  The EKG was personally reviewed and demonstrates: Wide complex tachycardia, rate of 174, with irregular rhythm. Consistent with atrial fibrillation with underlying RBBB. Telemetry:  Telemetry was personally reviewed and demonstrates:  Atrial fibrillation with rates in the 90s to low 110s at rest but increases to 130s with little activity.  Relevant CV Studies:  Echo pending.  Laboratory Data:  High Sensitivity Troponin:   Recent Labs  Lab 09/17/20 1314 09/17/20 1522  TROPONINIHS 35* 31*     Chemistry Recent Labs  Lab 09/17/20 1314 09/18/20 0312  NA 134* 139  K 4.5 4.6  CL 97* 97*  CO2 28 27  GLUCOSE 132* 151*  BUN 25* 28*   CREATININE 1.21 1.28*  CALCIUM 8.2* 8.5*  GFRNONAA >60 58*  ANIONGAP 9 15    Recent Labs  Lab 09/17/20 1314 09/18/20 0312  PROT 7.3 7.1  ALBUMIN 3.8 3.6  AST 37 37  ALT 23 25  ALKPHOS 67 57  BILITOT 1.5* 1.1   Hematology Recent Labs  Lab 09/17/20 1314 09/18/20 0312 09/18/20 0729  WBC 14.8* 9.6 7.8  RBC 4.36 4.06* 4.02*  HGB 12.5* 11.3* 11.1*  HCT 39.2 36.5* 35.5*  MCV 89.9 89.9 88.3  MCH 28.7 27.8 27.6  MCHC 31.9 31.0 31.3  RDW 14.0 14.0 14.0  PLT 350 319 309   BNP Recent Labs  Lab 09/17/20 1314  BNP 445.0*    DDimer No results for input(s): DDIMER in the last 168 hours.   Radiology/Studies:  [facility] Chest Port 1 View  Result Date: 09/18/2020 CLINICAL DATA:  Increased shortness of breath. EXAM: PORTABLE CHEST 1 VIEW COMPARISON:  09/17/2020 FINDINGS: Again noted is enlargement of the cardiac silhouette. There is volume loss in the right lower chest with streaky densities at the right lung base probably represent areas of atelectasis or scarring. Additional streaky densities in the right mid lung are similar to the recent comparison examination. No overt pulmonary edema. Trachea is midline. Negative for a pneumothorax. IMPRESSION: Persistent volume loss in the right lower chest with streaky densities in the mid and lower right lung. Findings are similar to the comparison exam on 09/17/2020. Suspect there is a chronic component to these right lung densities. Acute on chronic disease cannot be excluded. Stable cardiomegaly. Electronically Signed   By: Markus Daft M.D.   On: 09/18/2020 10:28   DG Chest Port 1 View  Result Date: 09/17/2020 CLINICAL DATA:  Shortness of breath, history of COPD and CHF EXAM: PORTABLE CHEST 1 VIEW COMPARISON:  None. FINDINGS: The heart is mildly enlarged. The mediastinal contours are within normal  limits. There are patchy and reticular opacities throughout the right lung. There is a small right pleural effusion layering along the chest wall. The  left lung is clear. There is no pneumothorax. There is no acute osseous abnormality. IMPRESSION: 1. Patchy opacities throughout the right lung with an associated small pleural effusion may reflect pneumonia in the correct clinical setting. Recommend follow-up radiographs in 6-8 weeks to assess for resolution. 2. Mild cardiomegaly. Electronically Signed   By: Valetta Mole MD   On: 09/17/2020 13:53     Assessment and Plan:   Paroxysmal Atrial Fibrillation  - Patient presented with worsening dyspnea on exertion for the last couple of months as well as weight gain and edema. Found to be in atrial fibrillation with RVR. Rates initially in the 170s - improved with IV Cardizem but still elevated. - Rates currently in the 90s to 110s at rest but increase to 130s with little activity. - Potassium 4.5.  - Magnesium 1.2 yesterday. Supplemented by primary team. Repeat lab pending. - Will check TSH. - Echo pending.  - Continue IV Cardizem for now. - Currently on Lopressor 72m twice daily. On this three times daily at home. Will increase to home dose. - CHA2D2-VASc = 6 (CAD, CHF, HTN, DM, age x2). Will start on IV Heparin for now until Echo results come back. If EF OK and no regional wall motion abnormalities, can transition to DOAC. - If we are able to get better rate control, can likely plan for outpatient DCCV after 3 weeks of uninterrupted anticoagulation.  Acute on Chronic Combined CHF - BNP elevated at 445.  - Chest x-ray showed right lung opacities that may reflect pneumonia but no overt edema.  - Echo pending. - Currently on  IV Lasix 452mtwice daily. Only net negative 169 mL so far. Renal function stable.  - Continue current dose of IV Lasix. May need to increase dose. - Continue Lopressor as above. Transition to Toprol-XL prior to discharge. - Will wait to start ARB/ARNI for now to allow for more rate control and until we have Echo results.  - Continue to monitor daily weights, strict I/O's,  and renal function.  Elevated Troponin CAD - History of CAD s/p PCIx2. - High-sensitivity troponin minimally elevated and flat at 35 >>32. Consistent with demand ischemia from atrial fibrillation with RVR and CHF. - No chest pain.  - Echo pending. - On DAPT with Aspirin and Plavix at home. Will continue this for now. Suspect we may stop Plavix, when we add DOAC. - Continue beta-blocker as above.  - Continue high-intensity statin. - No ischemic work-up planned at this time. If EF is drastically low or there are wall motion abnormalities, we may need to consider cath.  Hypertension - BP mostly well controlled. - Continue Lopressor and IV Diltiazem as above.  Hyperlipidemia - Continue Lipitor 4054maily.  Type 2 Diabetes Mellitus  - Management per primary team.  COPD - Management per primary team.   Risk Assessment/Risk Scores:    New York Heart Association (NYHA) Functional Class NYHA Class III  CHA2DS2-VASc Score = 6  his indicates a 9.7% annual risk of stroke. The patient's score is based upon: CHF History: Yes HTN History: Yes Diabetes History: Yes Stroke History: No Vascular Disease History: Yes Age Score: 2 Gender Score: 0    For questions or updates, please contact [email or web address] consult [email or web address] for contact info under    Signed, CalDarreld McleanA-C  09/18/2020 12:17 PM  I have seen and examined the patient along with Darreld Mclean, PA-C.  I have reviewed the chart, notes and new data.  I agree with PA/NP's note.  Key new complaints: Improved breathing and less weakness since hospitalization.  Remains very fatigued.  Still has some palpitations.  Denies chest pain before or during this admission. Key examination changes: Severely obese, emphysematous chest and plethoric facies, very hard to see jugular venous pulsations, diffusely diminished breath sounds with scanty wheezes and no moist rales, irregular rhythm, tachycardic, widely split  S2 without murmurs or rubs.  1-2+ pitting edema almost to the knees bilaterally. Key new findings / data: Shows atrial fibrillation and right bundle branch block, no clear acute ischemic repolarization abnormalities, remains in RVR on telemetry with rates currently around 130 bpm.  PLAN: There are reasons to believe that he has had deterioration in left ventricular systolic function.  This could be due to tachycardia related cardiomyopathy.  It sounds like he has been in persistent atrial fibrillation with RVR for about a month at least. Check echocardiogram today.   - If LVEF is indeed down and especially if there are regional wall motion abnormalities, he should probably undergo repeat coronary angiography.   - If LVEF is normal and there are no regional wall motion abnormalities, can treat with rate/rhythm control and diuretics. -When he was in sinus rhythm, he did not tolerate doses of metoprolol tartrate greater than 25 mg 3 times daily due to fatigue. -Depending on LVEF, will consider adding digoxin versus diltiazem. -May need early cardioversion if we cannot control his rate.  For now he is on IV heparin (in addition to chronic aspirin and clopidogrel) in case we need to do a cardiac catheterization.  Plan DOAC +/- clopidogrel long-term.  Sanda Klein, MD, [place] [phone] 09/18/2020, 12:53 PM

## 2020-09-19 ENCOUNTER — Inpatient Hospital Stay (HOSPITAL_COMMUNITY): Payer: Medicare Other

## 2020-09-19 DIAGNOSIS — I4891 Unspecified atrial fibrillation: Secondary | ICD-10-CM

## 2020-09-19 DIAGNOSIS — I2510 Atherosclerotic heart disease of native coronary artery without angina pectoris: Secondary | ICD-10-CM | POA: Diagnosis not present

## 2020-09-19 DIAGNOSIS — I5033 Acute on chronic diastolic (congestive) heart failure: Secondary | ICD-10-CM

## 2020-09-19 DIAGNOSIS — J441 Chronic obstructive pulmonary disease with (acute) exacerbation: Secondary | ICD-10-CM | POA: Diagnosis not present

## 2020-09-19 LAB — BASIC METABOLIC PANEL
Anion gap: 12 (ref 5–15)
BUN: 39 mg/dL — ABNORMAL HIGH (ref 8–23)
CO2: 29 mmol/L (ref 22–32)
Calcium: 8.5 mg/dL — ABNORMAL LOW (ref 8.9–10.3)
Chloride: 95 mmol/L — ABNORMAL LOW (ref 98–111)
Creatinine, Ser: 1.57 mg/dL — ABNORMAL HIGH (ref 0.61–1.24)
GFR, Estimated: 45 mL/min — ABNORMAL LOW (ref >60)
Glucose, Bld: 163 mg/dL — ABNORMAL HIGH (ref 70–99)
Potassium: 4.3 mmol/L (ref 3.5–5.1)
Sodium: 136 mmol/L (ref 135–145)

## 2020-09-19 LAB — GLUCOSE, CAPILLARY
Glucose-Capillary: 145 mg/dL — ABNORMAL HIGH (ref 70–99)
Glucose-Capillary: 174 mg/dL — ABNORMAL HIGH (ref 70–99)
Glucose-Capillary: 198 mg/dL — ABNORMAL HIGH (ref 70–99)
Glucose-Capillary: 192 mg/dL — ABNORMAL HIGH (ref 70–99)

## 2020-09-19 LAB — CBC
HCT: 39.1 % (ref 39.0–52.0)
Hemoglobin: 12.4 g/dL — ABNORMAL LOW (ref 13.0–17.0)
MCH: 28.1 pg (ref 26.0–34.0)
MCHC: 31.7 g/dL (ref 30.0–36.0)
MCV: 88.5 fL (ref 80.0–100.0)
Platelets: 263 10*3/uL (ref 150–400)
RBC: 4.42 MIL/uL (ref 4.22–5.81)
RDW: 14 % (ref 11.5–15.5)
WBC: 15.1 10*3/uL — ABNORMAL HIGH (ref 4.0–10.5)
nRBC: 0 % (ref 0.0–0.2)

## 2020-09-19 LAB — HEPARIN LEVEL (UNFRACTIONATED): Heparin Unfractionated: 0.88 IU/mL — ABNORMAL HIGH (ref 0.30–0.70)

## 2020-09-19 MED ORDER — GUAIFENESIN ER 600 MG PO TB12
600.0000 mg | ORAL_TABLET | Freq: Two times a day (BID) | ORAL | Status: DC
  Administered 2020-09-19 – 2020-09-23 (×8): 600 mg via ORAL
  Filled 2020-09-19 (×8): qty 1

## 2020-09-19 MED ORDER — APIXABAN 5 MG PO TABS
5.0000 mg | ORAL_TABLET | Freq: Two times a day (BID) | ORAL | Status: DC
  Administered 2020-09-19 – 2020-10-01 (×26): 5 mg via ORAL
  Filled 2020-09-19 (×26): qty 1

## 2020-09-19 MED ORDER — FUROSEMIDE 10 MG/ML IJ SOLN
80.0000 mg | Freq: Two times a day (BID) | INTRAMUSCULAR | Status: DC
  Administered 2020-09-19 – 2020-09-21 (×4): 80 mg via INTRAVENOUS
  Filled 2020-09-19 (×4): qty 8

## 2020-09-19 MED ORDER — FUROSEMIDE 10 MG/ML IJ SOLN
40.0000 mg | Freq: Once | INTRAMUSCULAR | Status: AC
  Administered 2020-09-19: 40 mg via INTRAVENOUS
  Filled 2020-09-19: qty 4

## 2020-09-19 MED ORDER — DILTIAZEM HCL ER COATED BEADS 180 MG PO CP24
300.0000 mg | ORAL_CAPSULE | Freq: Every day | ORAL | Status: DC
  Administered 2020-09-19: 300 mg via ORAL
  Filled 2020-09-19: qty 1

## 2020-09-19 MED ORDER — SODIUM CHLORIDE 0.9 % IV SOLN
INTRAVENOUS | Status: DC | PRN
  Administered 2020-09-19 – 2020-09-20 (×2): 500 mL via INTRAVENOUS

## 2020-09-19 NOTE — Progress Notes (Addendum)
PROGRESS NOTE    Dalton Juarez  ZOX:[id number] DOB: January 25, 1946 DOA: 09/17/2020 PCP: Smith Robert, MD   Brief Narrative:  This 75 year old male with COPD on chronic 2 L of supplemental oxygen via [place] cannula, T2DM, CHF/ischemic cardiomyopathy, atrial fibrillation presented to the ED with 2 to 3 weeks history of progressively worsening shortness of breath, leg swelling, 10 pound weight gain, progressively worsening DOE and has been using CPAP during the daytime.  Reportedly slept with CPAP disconnected on the night prior to admission and woke up short of breath despite increased oxygen use and presented to the ED In the ED tachycardic and alternating with RVR, placed on Cardizem drip, chest x-ray with bilateral patchy opacity,  placed on Levaquin, and BiPAP and transferred to [facility] long stepdown unit. Cardiology consulted.  Assessment & Plan:   Principal Problem:   CHF exacerbation (HCC) Active Problems:   CAD (coronary artery disease)   Atrial fibrillation with RVR (HCC)   COPD with acute exacerbation (HCC)   Acute decompensation of chronic systolic CHF:  Echo from [facility] in 03/15/16 showed LVEF 35%. Repeat echo LVEF 49%, Continue aggressive diuresis with IV Lasix.  Continue BiPAP,   Repeat chest x-ray this morning- unchanged. Cardiology consulted.    Monitor electrolytes closely and renal function has creatinine slightly uptrending.    Monitor intake, output and daily weight as below.  Cardio recommended continue aggressive diuresis with Lasix 80 mg every 12hr.  Net IO Since Admission: -169.87 mL [09/18/20 1013]       Filed Weights    09/17/20 1303 09/18/20 0029 09/18/20 0422  Weight: 99.8 kg 104.5 kg 104.5 kg     Intake/Output Summary (Last 24 hours) at 09/19/2020 1245 Last data filed at 09/19/2020 1203 Gross per 24 hour  Intake 525.49 ml  Output 800 ml  Net -274.51 ml      Atrial fibrillation with RVR:  Heart rate remains controlled on Cardizem gtt.  Cardiology started  Eliquis for anticoagulation. Cardizem drip discontinued,  transitioned to Cardizem 300 p.o. daily Continue to monitor heart rate.   CAD:  Denies any chest pain., troponin borderline elevated 35>31, suspect demand mismatch.  Continue aspirin, Plavix and Lipitor 40 mg   COPD Chronic hypoxic respiratory failure on 2 L [place]  at rest and 3l on ambulation: Acute with chronic hypoxic respiratory failure-needing bipap Multifactorial from CHF exacerbation and component of COPD exacerbation.   Patient is on steroid IV - transition to PO over the weekend,  Cont on bronchodilators/,supplemental oxygen   OSA on CPAP currently needing BiPAP   Type 2 diabetes: Well controlled HbA1c 6.3 09/17/20, hold p.o. meds, continue sliding scale.   Morbid obesity w/ BMI 36: Will benefit with weight loss, healthy lifestyle and PCP follow-up   Hypomagnesemia repleted on admission.  Repeating mag level   Leukocytosis : received antibiotics x1 dose in the ED. procalcitonin less than 0.10 This could be due to IV Solu-Medrol patient is getting for COPD.     DVT prophylaxis: Eliquis Code Status: Full code. Family Communication: No family at bed side. Disposition Plan:    Status is: Inpatient  Remains inpatient appropriate because:Inpatient level of care appropriate due to severity of illness  Dispo: The patient is from: Home              Anticipated d/c is to: Home              Patient currently is not medically stable to d/c.   Difficult to place patient No  Consultants:  Cardiology  Procedures: Echo Antimicrobials:   Anti-infectives (From admission, onward)    Start     Dose/Rate Route Frequency Ordered Stop   09/17/20 1415  levofloxacin (LEVAQUIN) IVPB 500 mg        500 mg 100 mL/hr over 60 Minutes Intravenous  Once 09/17/20 1400 09/17/20 1818        Subjective: Patient was seen and examined at bedside.  He is lying comfortably in the bed.   Denies any chest pain or shortness of breath,   he reports having cough.   Heart rate is improving , blood pressure is better controlled.  He reports feeling 50% better.  Objective: Vitals:   09/19/20 0900 09/19/20 0938 09/19/20 1000 09/19/20 1100  BP:  138/78    Pulse: 92 (!) 117 (!) 127 66  Resp: (!) 22  (!) 25 (!) 28  Temp:      TempSrc:      SpO2: 99%  95% 96%  Weight:      Height:        Intake/Output Summary (Last 24 hours) at 09/19/2020 1245 Last data filed at 09/19/2020 1203 Gross per 24 hour  Intake 525.49 ml  Output 800 ml  Net -274.51 ml   Filed Weights   09/18/20 0029 09/18/20 0422 09/19/20 0500  Weight: 104.5 kg 104.5 kg 104.2 kg    Examination:  General exam: Sitting comfortably on the bed, not in any acute distress. Respiratory system: Mild wheezing to auscultation, respiratory effort normal, RR 15. Cardiovascular system: S1 & S2 heard, RRR. No JVD, murmurs, rubs, gallops or clicks. No pedal edema. Gastrointestinal system: Abdomen is nondistended, soft and nontender. No organomegaly or masses felt. Normal bowel sounds heard. Central nervous system: Alert and oriented x3, no focal neurological deficits. Extremities: 2+ pitting edema noted, no edema, no cyanosis, no clubbing. Skin: No rashes, lesions or ulcers Psychiatry: Judgement and insight appear normal. Mood & affect appropriate.     Data Reviewed: I have personally reviewed following labs and imaging studies  CBC: Recent Labs  Lab 09/17/20 1314 09/18/20 0312 09/18/20 0729 09/19/20 0305  WBC 14.8* 9.6 7.8 15.1*  NEUTROABS 9.9*  --   --   --   HGB 12.5* 11.3* 11.1* 12.4*  HCT 39.2 36.5* 35.5* 39.1  MCV 89.9 89.9 88.3 88.5  PLT 350 319 309 263   Basic Metabolic Panel: Recent Labs  Lab 09/17/20 1314 09/18/20 0312 09/18/20 1254 09/19/20 0305  NA 134* 139  --  136  K 4.5 4.6  --  4.3  CL 97* 97*  --  95*  CO2 28 27  --  29  GLUCOSE 132* 151*  --  163*  BUN 25* 28*  --  39*  CREATININE 1.21 1.28*  --  1.57*  CALCIUM 8.2* 8.5*  --   8.5*  MG 1.2*  --  1.6*  --    GFR: Estimated Creatinine Clearance: 45.3 mL/min (A) (by C-G formula based on SCr of 1.57 mg/dL (H)). Liver Function Tests: Recent Labs  Lab 09/17/20 1314 09/18/20 0312  AST 37 37  ALT 23 25  ALKPHOS 67 57  BILITOT 1.5* 1.1  PROT 7.3 7.1  ALBUMIN 3.8 3.6   No results for input(s): LIPASE, AMYLASE in the last 168 hours. No results for input(s): AMMONIA in the last 168 hours. Coagulation Profile: Recent Labs  Lab 09/17/20 1314  INR 1.1   Cardiac Enzymes: No results for input(s): CKTOTAL, CKMB, CKMBINDEX, TROPONINI in the last 168  hours. BNP (last 3 results) No results for input(s): PROBNP in the last 8760 hours. HbA1C: Recent Labs    09/17/20 1314  HGBA1C 6.3*   CBG: Recent Labs  Lab 09/18/20 1653 09/18/20 1813 09/18/20 2144 09/19/20 0823 09/19/20 1214  GLUCAP 159* 175* 180* 145* 174*   Lipid Profile: No results for input(s): CHOL, HDL, LDLCALC, TRIG, CHOLHDL, LDLDIRECT in the last 72 hours. Thyroid Function Tests: Recent Labs    09/18/20 1254  TSH 0.797   Anemia Panel: No results for input(s): VITAMINB12, FOLATE, FERRITIN, TIBC, IRON, RETICCTPCT in the last 72 hours. Sepsis Labs: Recent Labs  Lab 09/17/20 1342 09/17/20 1522 09/18/20 1254  PROCALCITON  --   --  <0.10  LATICACIDVEN 1.6 1.3  --     Recent Results (from the past 240 hour(s))  Resp Panel by RT-PCR (Flu A&B, Covid) Nasopharyngeal Swab     Status: None   Collection Time: 09/17/20  6:00 PM   Specimen: Nasopharyngeal Swab; Nasopharyngeal(NP) swabs in vial transport medium  Result Value Ref Range Status   SARS Coronavirus 2 by RT PCR NEGATIVE NEGATIVE Final    Comment: (NOTE) SARS-CoV-2 target nucleic acids are NOT DETECTED.  The SARS-CoV-2 RNA is generally detectable in upper respiratory specimens during the acute phase of infection. The lowest concentration of SARS-CoV-2 viral copies this assay can detect is 138 copies/mL. A negative result does not  preclude SARS-Cov-2 infection and should not be used as the sole basis for treatment or other patient management decisions. A negative result may occur with  improper specimen collection/handling, submission of specimen other than nasopharyngeal swab, presence of viral mutation(s) within the areas targeted by this assay, and inadequate number of viral copies(<138 copies/mL). A negative result must be combined with clinical observations, patient history, and epidemiological information. The expected result is Negative.  Fact Sheet for Patients:  BloggerCourse.com  Fact Sheet for Healthcare Providers:  SeriousBroker.it  This test is no t yet approved or cleared by the [place] FDA and  has been authorized for detection and/or diagnosis of SARS-CoV-2 by FDA under an Emergency Use Authorization (EUA). This EUA will remain  in effect (meaning this test can be used) for the duration of the COVID-19 declaration under Section 564(b)(1) of the Act, 21 U.S.C.section 360bbb-3(b)(1), unless the authorization is terminated  or revoked sooner.       Influenza A by PCR NEGATIVE NEGATIVE Final   Influenza B by PCR NEGATIVE NEGATIVE Final    Comment: (NOTE) The Xpert Xpress SARS-CoV-2/FLU/RSV plus assay is intended as an aid in the diagnosis of influenza from Nasopharyngeal swab specimens and should not be used as a sole basis for treatment. Nasal washings and aspirates are unacceptable for Xpert Xpress SARS-CoV-2/FLU/RSV testing.  Fact Sheet for Patients: BloggerCourse.com  Fact Sheet for Healthcare Providers: SeriousBroker.it  This test is not yet approved or cleared by the [place] FDA and has been authorized for detection and/or diagnosis of SARS-CoV-2 by FDA under an Emergency Use Authorization (EUA). This EUA will remain in effect (meaning this test can be used) for the  duration of the COVID-19 declaration under Section 564(b)(1) of the Act, 21 U.S.C. section 360bbb-3(b)(1), unless the authorization is terminated or revoked.  Performed at [facility], [address], [place], [place] [address]   MRSA Next Gen by PCR, Nasal     Status: Abnormal   Collection Time: 09/18/20 12:17 AM   Specimen: Nasal Mucosa; Nasal Swab  Result Value Ref Range Status   MRSA  by PCR Next Gen DETECTED (A) NOT DETECTED Final    Comment: RESULT CALLED TO, READ BACK BY AND VERIFIED WITH: DANIELLE, RN @ [phone] ON 09/18/20 C VARNER (NOTE) The GeneXpert MRSA Assay (FDA approved for NASAL specimens only), is one component of a comprehensive MRSA colonization surveillance program. It is not intended to diagnose MRSA infection nor to guide or monitor treatment for MRSA infections. Test performance is not FDA approved in patients less than 60 years old. Performed at [facility], [address], [place], [place] [address]     Radiology Studies: [facility] Chest Port 1 View  Result Date: 09/18/2020 CLINICAL DATA:  Increased shortness of breath. EXAM: PORTABLE CHEST 1 VIEW COMPARISON:  09/17/2020 FINDINGS: Again noted is enlargement of the cardiac silhouette. There is volume loss in the right lower chest with streaky densities at the right lung base probably represent areas of atelectasis or scarring. Additional streaky densities in the right mid lung are similar to the recent comparison examination. No overt pulmonary edema. Trachea is midline. Negative for a pneumothorax. IMPRESSION: Persistent volume loss in the right lower chest with streaky densities in the mid and lower right lung. Findings are similar to the comparison exam on 09/17/2020. Suspect there is a chronic component to these right lung densities. Acute on chronic disease cannot be excluded. Stable cardiomegaly. Electronically Signed   By: Richarda Overlie M.D.   On: 09/18/2020 10:28   DG Chest Port 1 View  Result Date:  09/17/2020 CLINICAL DATA:  Shortness of breath, history of COPD and CHF EXAM: PORTABLE CHEST 1 VIEW COMPARISON:  None. FINDINGS: The heart is mildly enlarged. The mediastinal contours are within normal limits. There are patchy and reticular opacities throughout the right lung. There is a small right pleural effusion layering along the chest wall. The left lung is clear. There is no pneumothorax. There is no acute osseous abnormality. IMPRESSION: 1. Patchy opacities throughout the right lung with an associated small pleural effusion may reflect pneumonia in the correct clinical setting. Recommend follow-up radiographs in 6-8 weeks to assess for resolution. 2. Mild cardiomegaly. Electronically Signed   By: Lesia Hausen MD   On: 09/17/2020 13:53   ECHOCARDIOGRAM COMPLETE  Result Date: 09/18/2020    ECHOCARDIOGRAM REPORT   Patient Name:   Dalton Juarez Date of Exam: 09/18/2020 Medical Rec #:  [id number]     Height:       67.0 in Accession #:    [id number]    Weight:       230.4 lb Date of Birth:  July 23, 1945     BSA:          2.148 m Patient Age:    75 years      BP:           115/67 mmHg Patient Gender: M             HR:           107 bpm. Exam Location:  Inpatient Procedure: 2D Echo, 3D Echo, Cardiac Doppler and Color Doppler Indications:    I50.40* Unspecified combined systolic (congestive) and diastolic                 (congestive) heart failure  History:        Patient has no prior history of Echocardiogram examinations. CHF                 and Cardiomyopathy, CAD, Abnormal ECG, COPD, Arrythmias:Atrial  Fibrillation, Tachycardia and RBBB; Risk Factors:Former Smoker,                 Hypertension, Dyslipidemia and Diabetes.  Sonographer:    Sheralyn Boatman RDCS Referring Phys: [id number] STEPHEN K CHIU  Sonographer Comments: Technically difficult study due to poor echo windows. Image acquisition challenging due to patient body habitus. IMPRESSIONS  1. Left ventricular ejection fraction by 3D volume is 49 %. The  left ventricle has mildly decreased function. The left ventricle has no regional wall motion abnormalities. There is mild left ventricular hypertrophy. Left ventricular diastolic parameters  are indeterminate.  2. Right ventricular systolic function is normal. The right ventricular size is normal. There is mildly elevated pulmonary artery systolic pressure. The estimated right ventricular systolic pressure is 40.5 mmHg.  3. Right atrial size was moderately dilated.  4. The mitral valve is normal in structure. Moderate mitral valve regurgitation. No evidence of mitral stenosis.  5. The aortic valve is normal in structure. Aortic valve regurgitation is not visualized. Mild to moderate aortic valve sclerosis/calcification is present, without any evidence of aortic stenosis.  6. The inferior vena cava is normal in size with greater than 50% respiratory variability, suggesting right atrial pressure of 3 mmHg. FINDINGS  Left Ventricle: Left ventricular ejection fraction by 3D volume is 49 %. The left ventricle has mildly decreased function. The left ventricle has no regional wall motion abnormalities. The left ventricular internal cavity size was normal in size. There is mild left ventricular hypertrophy. Left ventricular diastolic parameters are indeterminate.  LV Wall Scoring: The basal inferior segment is akinetic. Right Ventricle: The right ventricular size is normal. No increase in right ventricular wall thickness. Right ventricular systolic function is normal. There is mildly elevated pulmonary artery systolic pressure. The tricuspid regurgitant velocity is 2.85  m/s, and with an assumed right atrial pressure of 8 mmHg, the estimated right ventricular systolic pressure is 40.5 mmHg. Left Atrium: Left atrial size was normal in size. Right Atrium: Right atrial size was moderately dilated. Pericardium: There is no evidence of pericardial effusion. Mitral Valve: The mitral valve is normal in structure. Mild mitral  annular calcification. Moderate mitral valve regurgitation, with eccentric laterally directed jet. No evidence of mitral valve stenosis. Tricuspid Valve: The tricuspid valve is normal in structure. Tricuspid valve regurgitation is mild . No evidence of tricuspid stenosis. Aortic Valve: The aortic valve is normal in structure. Aortic valve regurgitation is not visualized. Mild to moderate aortic valve sclerosis/calcification is present, without any evidence of aortic stenosis. Pulmonic Valve: The pulmonic valve was normal in structure. Pulmonic valve regurgitation is not visualized. No evidence of pulmonic stenosis. Aorta: The aortic root is normal in size and structure. Venous: The inferior vena cava is normal in size with greater than 50% respiratory variability, suggesting right atrial pressure of 3 mmHg. IAS/Shunts: No atrial level shunt detected by color flow Doppler.  LEFT VENTRICLE PLAX 2D LVIDd:         5.30 cm LVIDs:         4.10 cm LV PW:         1.30 cm         3D Volume EF LV IVS:        1.20 cm         LV 3D EF:    Left  ventricul                                             ar LV Volumes (MOD)                            ejection LV vol d, MOD    97.6 ml                    fraction A2C:                                        by 3D LV vol d, MOD    77.1 ml                    volume is A4C:                                        49 %. LV vol s, MOD    63.5 ml A2C: LV vol s, MOD    42.7 ml       3D Volume EF: A4C:                           3D EF:        49 % LV SV MOD A2C:   34.1 ml       LV EDV:       119 ml LV SV MOD A4C:   77.1 ml       LV ESV:       61 ml LV SV MOD BP:    38.0 ml       LV SV:        59 ml RIGHT VENTRICLE            IVC RV S prime:     6.31 cm/s  IVC diam: 2.50 cm TAPSE (M-mode): 1.1 cm LEFT ATRIUM             Index       RIGHT ATRIUM           Index LA diam:        4.20 cm 1.96 cm/m  RA Area:     23.30 cm LA Vol (A2C):   60.5 ml 28.17 ml/m RA  Volume:   73.50 ml  34.23 ml/m LA Vol (A4C):   42.9 ml 19.98 ml/m LA Biplane Vol: 51.4 ml 23.93 ml/m  AORTIC VALVE LVOT Vmax:   121.00 cm/s LVOT Vmean:  75.300 cm/s LVOT VTI:    0.179 m  AORTA Ao Root diam: 3.20 cm Ao Asc diam:  2.80 cm MITRAL VALVE                 TRICUSPID VALVE MV Area (PHT): 3.86 cm      TR Peak grad:   32.5 mmHg MV Decel Time: 196 msec      TR Vmax:        285.00 cm/s MR Peak grad:    91.0 mmHg MR Mean grad:    58.0 mmHg   SHUNTS MR Vmax:  477.00 cm/s Systemic VTI: 0.18 m MR Vmean:        363.0 cm/s MR PISA:         1.01 cm MR PISA Eff ROA: 6 mm MR PISA Radius:  0.40 cm MV E velocity: 130.33 cm/s Donato Schultz MD Electronically signed by Donato Schultz MD Signature Date/Time: 09/18/2020/3:45:00 PM    Final     Scheduled Meds:  apixaban  5 mg Oral BID   atorvastatin  40 mg Oral Daily   chlorhexidine  15 mL Mouth Rinse BID   Chlorhexidine Gluconate Cloth  6 each Topical Daily   cholecalciferol  2,000 Units Oral Daily   clopidogrel  75 mg Oral Daily   diltiazem  300 mg Oral Daily   dorzolamide  1 drop Left Eye BID   furosemide  80 mg Intravenous BID   gabapentin  300 mg Oral TID   insulin aspart  0-15 Units Subcutaneous TID WC   insulin aspart  0-5 Units Subcutaneous QHS   ipratropium-albuterol  3 mL Nebulization Q6H   mouth rinse  15 mL Mouth Rinse q12n4p   methylPREDNISolone (SOLU-MEDROL) injection  40 mg Intravenous Daily   metoprolol tartrate  25 mg Oral TID   mometasone-formoterol  2 puff Inhalation BID   multivitamin with minerals  1 tablet Oral Daily   mupirocin ointment  1 application Nasal BID   pantoprazole  40 mg Oral Daily   Continuous Infusions:   LOS: 2 days    Time spent: 35 mins    Theon Sobotka, MD [facility]   If 7PM-7AM, please contact night-coverage prog

## 2020-09-19 NOTE — Progress Notes (Signed)
ANTICOAGULATION CONSULT NOTE   Pharmacy Consult for heparin Indication: atrial fibrillation  Allergies  Allergen Reactions   Advair Hfa [Fluticasone-Salmeterol]    Amiodarone Other (See Comments)    Bradycardia   Anoro Ellipta [Umeclidinium-Vilanterol]    Breo Ellipta [Fluticasone Furoate-Vilanterol]    Brovana [Arformoterol]    Budesonide    Entresto [Sacubitril-Valsartan]    Oxycodone Other (See Comments)    Required Narcan gtt after 10 of oxycodone for obtundation   Spiriva Respimat [Tiotropium Bromide Monohydrate]     Patient Measurements: Height: 5\' 7"  (170.2 cm) Weight: 104.5 kg (230 lb 6.1 oz) IBW/kg (Calculated) : 66.1 Heparin Dosing Weight: 89 kg  Vital Signs: Temp: 97.5 F (36.4 C) (08/12 2300) Temp Source: Oral (08/12 2300) BP: 106/87 (08/13 0000) Pulse Rate: 111 (08/13 0000)  Labs: Recent Labs    09/17/20 1314 09/17/20 1522 09/18/20 0312 09/18/20 0729 09/19/20 0015  HGB 12.5*  --  11.3* 11.1*  --   HCT 39.2  --  36.5* 35.5*  --   PLT 350  --  319 309  --   LABPROT 14.6  --   --   --   --   INR 1.1  --   --   --   --   HEPARINUNFRC  --   --   --   --  0.88*  CREATININE 1.21  --  1.28*  --   --   TROPONINIHS 35* 31*  --   --   --      Estimated Creatinine Clearance: 55.7 mL/min (A) (by C-G formula based on SCr of 1.28 mg/dL (H)).   Medical History: Past Medical History:  Diagnosis Date   Acute on chronic right-sided congestive heart failure (HCC)    Anemia in other chronic diseases classified elsewhere    Atrial fibrillation (HCC)    CAD (coronary artery disease)    COPD (chronic obstructive pulmonary disease) (HCC)    Ischemic cardiomyopathy    Seasonal allergic rhinitis due to pollen    Tachypnea    Vitamin D deficiency     Medications: Pt is not on anticoagulants PTA  Assessment: Pt is a 11 yoM with PMH significant for CAD, HF, afib (not on anticoagulation PTA). Admitted with SOB/tachycardia found to be in afib with RVR - cardiology  consulted. Pharmacy consulted to dose/monitor heparin drip.   Today, 09/19/20 HL 0.88 supra-therapeutic on 1300 units/hr Per RN no bleeding  Goal of Therapy:  Heparin level 0.3-0.7 units/ml Monitor platelets by anticoagulation protocol: Yes   Plan:  Decrease heparin drip to 1100 units/hr Heparin level in 8 hours Daily CBC  09/21/20 RPh 09/19/2020, 1:34 AM

## 2020-09-19 NOTE — Discharge Instructions (Signed)

## 2020-09-19 NOTE — Progress Notes (Signed)
09/19/2020 Patient states he received initial 2 doses of Moderna vaccine, followed by one Moderna booster. He would like to receive his second booster prior to hospital discharge, if possible. Cindy S. Clelia Croft BSN, RN, [facility] 09/19/2020 10:32 PM

## 2020-09-19 NOTE — Progress Notes (Signed)
DAILY PROGRESS NOTE   Patient Name: Dalton Juarez Date of Encounter: 09/19/2020 Cardiologist: Thurmon Fair, MD  Chief Complaint   Hallucinations  Patient Profile   Dalton Juarez is a 75 y.o. male with a history of CAD, ischemic cardiomyopathy/chronic systolic CHF, paroxysmal atrial fibrillation not on anticoagulation at home, iatrogenic hemothorax from bedside thoracentesis in 03/2016 s/p VATS decortication with mechanical pleurodesis at [facility], COPD on home O2, hypertension, hyperlipidemia, type 2 diabetes, GERD, and prior tobacco use (quit 4 years ago) who is being seen 09/18/2020 for the evaluation of paroxysmal atrial fibrillation at the request of Dr. Rhona Leavens.  Subjective   No issues overnight.  On CPAP. Says he has had hallucinations last evening, never had this before. Remains volume overloaded. Minimally net negative overnight (373 ml). Afib rate generally controlled on 10 mg/hr IV diltiazem. New leukocytosis today at 15K (was 7.8). Echo yesterday shows LVEF 49%. Creatinine 1.57 (1.28 on admission).  Objective   Vitals:   09/19/20 0309 09/19/20 0400 09/19/20 0500 09/19/20 0700  BP:  112/60    Pulse: 85 60  75  Resp: Temp:  97.8 F (36.6 C)    TempSrc:  Axillary    SpO2: 98% 97%  95%  Weight:   104.2 kg   Height:        Intake/Output Summary (Last 24 hours) at 09/19/2020 0813 Last data filed at 09/19/2020 0400 Gross per 24 hour  Intake 426.22 ml  Output 800 ml  Net -373.78 ml   Filed Weights   09/18/20 0029 09/18/20 0422 09/19/20 0500  Weight: 104.5 kg 104.5 kg 104.2 kg    Physical Exam   General appearance: alert, no distress, and on CPAP Neck: no carotid bruit, no JVD, and thyroid not enlarged, symmetric, no tenderness/mass/nodules Lungs: wheezes bilaterally Heart: irregularly irregular rhythm Abdomen: soft, non-tender; bowel sounds normal; no masses,  no organomegaly Extremities: edema 2+ bilateral pitting Pulses: 2+ and symmetric Skin: Skin  color, texture, turgor normal. No rashes or lesions Neurologic: Grossly normal Psych: Pleasant  Inpatient Medications    Scheduled Meds:  aspirin EC  81 mg Oral Daily   atorvastatin  40 mg Oral Daily   chlorhexidine  15 mL Mouth Rinse BID   Chlorhexidine Gluconate Cloth  6 each Topical Daily   cholecalciferol  2,000 Units Oral Daily   clopidogrel  75 mg Oral Daily   dorzolamide  1 drop Left Eye BID   furosemide  40 mg Intravenous BID   gabapentin  300 mg Oral TID   insulin aspart  0-15 Units Subcutaneous TID WC   insulin aspart  0-5 Units Subcutaneous QHS   ipratropium-albuterol  3 mL Nebulization Q6H   mouth rinse  15 mL Mouth Rinse q12n4p   methylPREDNISolone (SOLU-MEDROL) injection  40 mg Intravenous Daily   metoprolol tartrate  25 mg Oral TID   mometasone-formoterol  2 puff Inhalation BID   multivitamin with minerals  1 tablet Oral Daily   mupirocin ointment  1 application Nasal BID   pantoprazole  40 mg Oral Daily    Continuous Infusions:  diltiazem (CARDIZEM) infusion 10 mg/hr (09/19/20 0400)   heparin 1,100 Units/hr (09/19/20 0606)    PRN Meds: acetaminophen **OR** acetaminophen, ipratropium-albuterol, nitroGLYCERIN   Labs   Results for orders placed or performed during the hospital encounter of 09/17/20 (from the past 48 hour(s))  CBC with Differential     Status: Abnormal   Collection Time: 09/17/20  1:14 PM  Result Value Ref  Range   WBC 14.8 (H) 4.0 - 10.5 K/uL   RBC 4.36 4.22 - 5.81 MIL/uL   Hemoglobin 12.5 (L) 13.0 - 17.0 g/dL   HCT 30.8 65.7 - 84.6 %   MCV 89.9 80.0 - 100.0 fL   MCH 28.7 26.0 - 34.0 pg   MCHC 31.9 30.0 - 36.0 g/dL   RDW 96.2 95.2 - 84.1 %   Platelets 350 150 - 400 K/uL   nRBC 0.0 0.0 - 0.2 %   Neutrophils Relative % 67 %   Neutro Abs 9.9 (H) 1.7 - 7.7 K/uL   Lymphocytes Relative 22 %   Lymphs Abs 3.3 0.7 - 4.0 K/uL   Monocytes Relative 9 %   Monocytes Absolute 1.3 (H) 0.1 - 1.0 K/uL   Eosinophils Relative 1 %   Eosinophils  Absolute 0.1 0.0 - 0.5 K/uL   Basophils Relative 1 %   Basophils Absolute 0.1 0.0 - 0.1 K/uL   Immature Granulocytes 0 %   Abs Immature Granulocytes 0.05 0.00 - 0.07 K/uL    Comment: Performed at [facility], [address] [place], [place] [address]  Comprehensive metabolic panel     Status: Abnormal   Collection Time: 09/17/20  1:14 PM  Result Value Ref Range   Sodium 134 (L) 135 - 145 mmol/L   Potassium 4.5 3.5 - 5.1 mmol/L   Chloride 97 (L) 98 - 111 mmol/L   CO2 28 22 - 32 mmol/L   Glucose, Bld 132 (H) 70 - 99 mg/dL    Comment: Glucose reference range applies only to samples taken after fasting for at least 8 hours.   BUN 25 (H) 8 - 23 mg/dL   Creatinine, Ser 1.02 0.61 - 1.24 mg/dL   Calcium 8.2 (L) 8.9 - 10.3 mg/dL   Total Protein 7.3 6.5 - 8.1 g/dL   Albumin 3.8 3.5 - 5.0 g/dL   AST 37 15 - 41 U/L   ALT 23 0 - 44 U/L   Alkaline Phosphatase 67 38 - 126 U/L   Total Bilirubin 1.5 (H) 0.3 - 1.2 mg/dL   GFR, Estimated >72 >53 mL/min    Comment: (NOTE) Calculated using the CKD-EPI Creatinine Equation (2021)    Anion gap 9 5 - 15    Comment: Performed at [facility], [address], [place], [place] [address]  Magnesium     Status: Abnormal   Collection Time: 09/17/20  1:14 PM  Result Value Ref Range   Magnesium 1.2 (L) 1.7 - 2.4 mg/dL    Comment: Performed at [facility], [address], [place], [place] [address]  Troponin I (High Sensitivity)     Status: Abnormal   Collection Time: 09/17/20  1:14 PM  Result Value Ref Range   Troponin I (High Sensitivity) 35 (H) <18 ng/L    Comment: (NOTE) Elevated high sensitivity troponin I (hsTnI) values and significant  changes across serial measurements may suggest ACS but many other  chronic and acute conditions are known to elevate hsTnI results.  Refer to the "Links" section for chest pain algorithms and additional  guidance. Performed at [facility], [address], [place], [place] [address]   Protime-INR     Status: None    Collection Time: 09/17/20  1:14 PM  Result Value Ref Range   Prothrombin Time 14.6 11.4 - 15.2 seconds   INR 1.1 0.8 - 1.2    Comment: (NOTE) INR goal varies based on device and disease states. Performed at [facility], [phone]  [address], [place], [place] [address]   Brain natriuretic peptide     Status: Abnormal   Collection Time: 09/17/20  1:14 PM  Result Value Ref Range   B Natriuretic Peptide 445.0 (H) 0.0 - 100.0 pg/mL    Comment: Performed at [facility], [address] [place], [place] [address]  Hemoglobin A1c     Status: Abnormal   Collection Time: 09/17/20  1:14 PM  Result Value Ref Range   Hgb A1c MFr Bld 6.3 (H) 4.8 - 5.6 %    Comment: (NOTE) Pre diabetes:          5.7%-6.4%  Diabetes:              >6.4%  Glycemic control for   <7.0% adults with diabetes    Mean Plasma Glucose 134.11 mg/dL    Comment: Performed at [facility], [address], [place], [place] [address]  Lactic acid, plasma     Status: None   Collection Time: 09/17/20  1:42 PM  Result Value Ref Range   Lactic Acid, Venous 1.6 0.5 - 1.9 mmol/L    Comment: Performed at [facility], [address] [place], [place] [address]  Lactic acid, plasma     Status: None   Collection Time: 09/17/20  3:22 PM  Result Value Ref Range   Lactic Acid, Venous 1.3 0.5 - 1.9 mmol/L    Comment: Performed at [facility], [address] [place], [place] [address]  Troponin I (High Sensitivity)     Status: Abnormal   Collection Time: 09/17/20  3:22 PM  Result Value Ref Range   Troponin I (High Sensitivity) 31 (H) <18 ng/L    Comment: (NOTE) Elevated high sensitivity troponin I (hsTnI) values and significant  changes across serial measurements may suggest ACS but many other  chronic and acute conditions are known to elevate hsTnI results.  Refer to the "Links" section for chest pain algorithms and additional  guidance. Performed at [facility], [address] [place], [place] [address]   Resp Panel by RT-PCR  (Flu A&B, Covid) Nasopharyngeal Swab     Status: None   Collection Time: 09/17/20  6:00 PM   Specimen: Nasopharyngeal Swab; Nasopharyngeal(NP) swabs in vial transport medium  Result Value Ref Range   SARS Coronavirus 2 by RT PCR NEGATIVE NEGATIVE    Comment: (NOTE) SARS-CoV-2 target nucleic acids are NOT DETECTED.  The SARS-CoV-2 RNA is generally detectable in upper respiratory specimens during the acute phase of infection. The lowest concentration of SARS-CoV-2 viral copies this assay can detect is 138 copies/mL. A negative result does not preclude SARS-Cov-2 infection and should not be used as the sole basis for treatment or other patient management decisions. A negative result may occur with  improper specimen collection/handling, submission of specimen other than nasopharyngeal swab, presence of viral mutation(s) within the areas targeted by this assay, and inadequate number of viral copies(<138 copies/mL). A negative result must be combined with clinical observations, patient history, and epidemiological information. The expected result is Negative.  Fact Sheet for Patients:  BloggerCourse.com  Fact Sheet for Healthcare Providers:  SeriousBroker.it  This test is no t yet approved or cleared by the [place] FDA and  has been authorized for detection and/or diagnosis of SARS-CoV-2 by FDA under an Emergency Use Authorization (EUA). This EUA will remain  in effect (meaning this test can be used) for the duration of the COVID-19 declaration under Section 564(b)(1) of the Act, 21 U.S.C.section 360bbb-3(b)(1), unless  the authorization is terminated  or revoked sooner.       Influenza A by PCR NEGATIVE NEGATIVE   Influenza B by PCR NEGATIVE NEGATIVE    Comment: (NOTE) The Xpert Xpress SARS-CoV-2/FLU/RSV plus assay is intended as an aid in the diagnosis of influenza from Nasopharyngeal swab specimens and should not be used as  a sole basis for treatment. Nasal washings and aspirates are unacceptable for Xpert Xpress SARS-CoV-2/FLU/RSV testing.  Fact Sheet for Patients: BloggerCourse.com  Fact Sheet for Healthcare Providers: SeriousBroker.it  This test is not yet approved or cleared by the [place] FDA and has been authorized for detection and/or diagnosis of SARS-CoV-2 by FDA under an Emergency Use Authorization (EUA). This EUA will remain in effect (meaning this test can be used) for the duration of the COVID-19 declaration under Section 564(b)(1) of the Act, 21 U.S.C. section 360bbb-3(b)(1), unless the authorization is terminated or revoked.  Performed at [facility], [address] [place], [place] [address]   CBG monitoring, ED     Status: Abnormal   Collection Time: 09/17/20  6:33 PM  Result Value Ref Range   Glucose-Capillary 129 (H) 70 - 99 mg/dL    Comment: Glucose reference range applies only to samples taken after fasting for at least 8 hours.  CBG monitoring, ED     Status: Abnormal   Collection Time: 09/17/20 10:22 PM  Result Value Ref Range   Glucose-Capillary 155 (H) 70 - 99 mg/dL    Comment: Glucose reference range applies only to samples taken after fasting for at least 8 hours.  MRSA Next Gen by PCR, Nasal     Status: Abnormal   Collection Time: 09/18/20 12:17 AM   Specimen: Nasal Mucosa; Nasal Swab  Result Value Ref Range   MRSA by PCR Next Gen DETECTED (A) NOT DETECTED    Comment: RESULT CALLED TO, READ BACK BY AND VERIFIED WITH: DANIELLE, RN @ [phone] ON 09/18/20 C VARNER (NOTE) The GeneXpert MRSA Assay (FDA approved for NASAL specimens only), is one component of a comprehensive MRSA colonization surveillance program. It is not intended to diagnose MRSA infection nor to guide or monitor treatment for MRSA infections. Test performance is not FDA approved in patients less than 76 years old. Performed at [facility], [address] [place], [place] [address]   CBC     Status: Abnormal   Collection Time: 09/18/20  3:12 AM  Result Value Ref Range   WBC 9.6 4.0 - 10.5 K/uL   RBC 4.06 (L) 4.22 - 5.81 MIL/uL   Hemoglobin 11.3 (L) 13.0 - 17.0 g/dL   HCT 73.4 (L) 19.3 - 79.0 %   MCV 89.9 80.0 - 100.0 fL   MCH 27.8 26.0 - 34.0 pg   MCHC 31.0 30.0 - 36.0 g/dL   RDW 24.0 97.3 - 53.2 %   Platelets 319 150 - 400 K/uL   nRBC 0.0 0.0 - 0.2 %    Comment: Performed at [facility], [address], [place], [place] [address]  Comprehensive metabolic panel     Status: Abnormal   Collection Time: 09/18/20  3:12 AM  Result Value Ref Range   Sodium 139 135 - 145 mmol/L   Potassium 4.6 3.5 - 5.1 mmol/L   Chloride 97 (L) 98 - 111 mmol/L   CO2 27 22 - 32 mmol/L   Glucose, Bld 151 (H) 70 - 99 mg/dL    Comment: Glucose reference range applies only to samples taken  after fasting for at least 8 hours.   BUN 28 (H) 8 - 23 mg/dL   Creatinine, Ser 1.61 (H) 0.61 - 1.24 mg/dL   Calcium 8.5 (L) 8.9 - 10.3 mg/dL   Total Protein 7.1 6.5 - 8.1 g/dL   Albumin 3.6 3.5 - 5.0 g/dL   AST 37 15 - 41 U/L   ALT 25 0 - 44 U/L   Alkaline Phosphatase 57 38 - 126 U/L   Total Bilirubin 1.1 0.3 - 1.2 mg/dL   GFR, Estimated 58 (L) >60 mL/min    Comment: (NOTE) Calculated using the CKD-EPI Creatinine Equation (2021)    Anion gap 15 5 - 15    Comment: Performed at [facility], [address] [place], [place] [address]  CBC     Status: Abnormal   Collection Time: 09/18/20  7:29 AM  Result Value Ref Range   WBC 7.8 4.0 - 10.5 K/uL   RBC 4.02 (L) 4.22 - 5.81 MIL/uL   Hemoglobin 11.1 (L) 13.0 - 17.0 g/dL   HCT 54.0 (L) 98.1 - 19.1 %   MCV 88.3 80.0 - 100.0 fL   MCH 27.6 26.0 - 34.0 pg   MCHC 31.3 30.0 - 36.0 g/dL   RDW 47.8 29.5 - 62.1 %   Platelets 309 150 - 400 K/uL   nRBC 0.0 0.0 - 0.2 %    Comment: Performed at [facility], [address] [place],  [place] [address]  Glucose, capillary     Status: Abnormal   Collection Time: 09/18/20  8:18 AM  Result Value Ref Range   Glucose-Capillary 156 (H) 70 - 99 mg/dL    Comment: Glucose reference range applies only to samples taken after fasting for at least 8 hours.   Comment 1 Notify RN    Comment 2 Document in Chart   Glucose, capillary     Status: Abnormal   Collection Time: 09/18/20 12:49 PM  Result Value Ref Range   Glucose-Capillary 145 (H) 70 - 99 mg/dL    Comment: Glucose reference range applies only to samples taken after fasting for at least 8 hours.  Magnesium     Status: Abnormal   Collection Time: 09/18/20 12:54 PM  Result Value Ref Range   Magnesium 1.6 (L) 1.7 - 2.4 mg/dL    Comment: Performed at [facility], [address], [place], [place] [address]  Procalcitonin - Baseline     Status: None   Collection Time: 09/18/20 12:54 PM  Result Value Ref Range   Procalcitonin <0.10 ng/mL    Comment:        Interpretation: PCT (Procalcitonin) <= 0.5 ng/mL: Systemic infection (sepsis) is not likely. Local bacterial infection is possible. (NOTE)       Sepsis PCT Algorithm           Lower Respiratory Tract                                      Infection PCT Algorithm    ----------------------------     ----------------------------         PCT < 0.25 ng/mL                PCT < 0.10 ng/mL          Strongly encourage             Strongly discourage   discontinuation  of antibiotics    initiation of antibiotics    ----------------------------     -----------------------------       PCT 0.25 - 0.50 ng/mL            PCT 0.10 - 0.25 ng/mL               OR       >80% decrease in PCT            Discourage initiation of                                            antibiotics      Encourage discontinuation           of antibiotics    ----------------------------     -----------------------------         PCT >= 0.50 ng/mL              PCT 0.26 - 0.50 ng/mL               AND         <80% decrease in PCT             Encourage initiation of                                             antibiotics       Encourage continuation           of antibiotics    ----------------------------     -----------------------------        PCT >= 0.50 ng/mL                  PCT > 0.50 ng/mL               AND         increase in PCT                  Strongly encourage                                      initiation of antibiotics    Strongly encourage escalation           of antibiotics                                     -----------------------------                                           PCT <= 0.25 ng/mL                                                 OR                                        >  80% decrease in PCT                                      Discontinue / Do not initiate                                             antibiotics  Performed at [facility], [address] [place], [place] [address]   TSH     Status: None   Collection Time: 09/18/20 12:54 PM  Result Value Ref Range   TSH 0.797 0.350 - 4.500 uIU/mL    Comment: Performed by a 3rd Generation assay with a functional sensitivity of <=0.01 uIU/mL. Performed at [facility], [address] [place], [place] [address]   Glucose, capillary     Status: Abnormal   Collection Time: 09/18/20  4:53 PM  Result Value Ref Range   Glucose-Capillary 159 (H) 70 - 99 mg/dL    Comment: Glucose reference range applies only to samples taken after fasting for at least 8 hours.  Glucose, capillary     Status: Abnormal   Collection Time: 09/18/20  6:13 PM  Result Value Ref Range   Glucose-Capillary 175 (H) 70 - 99 mg/dL    Comment: Glucose reference range applies only to samples taken after fasting for at least 8 hours.  Glucose, capillary     Status: Abnormal   Collection Time: 09/18/20  9:44 PM  Result Value Ref Range   Glucose-Capillary 180 (H) 70 - 99 mg/dL    Comment: Glucose  reference range applies only to samples taken after fasting for at least 8 hours.  Heparin level (unfractionated)     Status: Abnormal   Collection Time: 09/19/20 12:15 AM  Result Value Ref Range   Heparin Unfractionated 0.88 (H) 0.30 - 0.70 IU/mL    Comment: (NOTE) The clinical reportable range upper limit is being lowered to >1.10 to align with the FDA approved guidance for the current laboratory assay.  If heparin results are below expected values, and patient dosage has  been confirmed, suggest follow up testing of antithrombin III levels. Performed at [facility], [address] [place], [place] [address]   Basic metabolic panel     Status: Abnormal   Collection Time: 09/19/20  3:05 AM  Result Value Ref Range   Sodium 136 135 - 145 mmol/L   Potassium 4.3 3.5 - 5.1 mmol/L   Chloride 95 (L) 98 - 111 mmol/L   CO2 29 22 - 32 mmol/L   Glucose, Bld 163 (H) 70 - 99 mg/dL    Comment: Glucose reference range applies only to samples taken after fasting for at least 8 hours.   BUN 39 (H) 8 - 23 mg/dL   Creatinine, Ser 9.141.57 (H) 0.61 - 1.24 mg/dL   Calcium 8.5 (L) 8.9 - 10.3 mg/dL   GFR, Estimated 45 (L) >60 mL/min    Comment: (NOTE) Calculated using the CKD-EPI Creatinine Equation (2021)    Anion gap 12 5 - 15    Comment: Performed at [facility], [address] [place], [place] [address]  CBC     Status: Abnormal   Collection Time: 09/19/20  3:05 AM  Result Value Ref Range   WBC 15.1 (H) 4.0 -  10.5 K/uL   RBC 4.42 4.22 - 5.81 MIL/uL   Hemoglobin 12.4 (L) 13.0 - 17.0 g/dL   HCT 01.0 27.2 - 53.6 %   MCV 88.5 80.0 - 100.0 fL   MCH 28.1 26.0 - 34.0 pg   MCHC 31.7 30.0 - 36.0 g/dL   RDW 64.4 03.4 - 74.2 %   Platelets 263 150 - 400 K/uL   nRBC 0.0 0.0 - 0.2 %    Comment: Performed at [facility] [place], [address], [place], [place] [address]    ECG   NA  Telemetry   AFib with RVR in the 100-110 range  - Personally  Reviewed  Radiology    DG Chest [facility] 1 View  Result Date: 09/18/2020 CLINICAL DATA:  Increased shortness of breath. EXAM: PORTABLE CHEST 1 VIEW COMPARISON:  09/17/2020 FINDINGS: Again noted is enlargement of the cardiac silhouette. There is volume loss in the right lower chest with streaky densities at the right lung base probably represent areas of atelectasis or scarring. Additional streaky densities in the right mid lung are similar to the recent comparison examination. No overt pulmonary edema. Trachea is midline. Negative for a pneumothorax. IMPRESSION: Persistent volume loss in the right lower chest with streaky densities in the mid and lower right lung. Findings are similar to the comparison exam on 09/17/2020. Suspect there is a chronic component to these right lung densities. Acute on chronic disease cannot be excluded. Stable cardiomegaly. Electronically Signed   By: Richarda Overlie M.D.   On: 09/18/2020 10:28   DG Chest Port 1 View  Result Date: 09/17/2020 CLINICAL DATA:  Shortness of breath, history of COPD and CHF EXAM: PORTABLE CHEST 1 VIEW COMPARISON:  None. FINDINGS: The heart is mildly enlarged. The mediastinal contours are within normal limits. There are patchy and reticular opacities throughout the right lung. There is a small right pleural effusion layering along the chest wall. The left lung is clear. There is no pneumothorax. There is no acute osseous abnormality. IMPRESSION: 1. Patchy opacities throughout the right lung with an associated small pleural effusion may reflect pneumonia in the correct clinical setting. Recommend follow-up radiographs in 6-8 weeks to assess for resolution. 2. Mild cardiomegaly. Electronically Signed   By: Lesia Hausen MD   On: 09/17/2020 13:53   ECHOCARDIOGRAM COMPLETE  Result Date: 09/18/2020    ECHOCARDIOGRAM REPORT   Patient Name:   Dalton Juarez Date of Exam: 09/18/2020 Medical Rec #:  [id number]     Height:       67.0 in Accession #:    [id number]     Weight:       230.4 lb Date of Birth:  22-Apr-1945     BSA:          2.148 m Patient Age:    75 years      BP:           115/67 mmHg Patient Gender: M             HR:           107 bpm. Exam Location:  Inpatient Procedure: 2D Echo, 3D Echo, Cardiac Doppler and Color Doppler Indications:    I50.40* Unspecified combined systolic (congestive) and diastolic                 (congestive) heart failure  History:        Patient has no prior history of Echocardiogram examinations. CHF  and Cardiomyopathy, CAD, Abnormal ECG, COPD, Arrythmias:Atrial                 Fibrillation, Tachycardia and RBBB; Risk Factors:Former Smoker,                 Hypertension, Dyslipidemia and Diabetes.  Sonographer:    Sheralyn Boatman RDCS Referring Phys: [id number] STEPHEN K CHIU  Sonographer Comments: Technically difficult study due to poor echo windows. Image acquisition challenging due to patient body habitus. IMPRESSIONS  1. Left ventricular ejection fraction by 3D volume is 49 %. The left ventricle has mildly decreased function. The left ventricle has no regional wall motion abnormalities. There is mild left ventricular hypertrophy. Left ventricular diastolic parameters  are indeterminate.  2. Right ventricular systolic function is normal. The right ventricular size is normal. There is mildly elevated pulmonary artery systolic pressure. The estimated right ventricular systolic pressure is 40.5 mmHg.  3. Right atrial size was moderately dilated.  4. The mitral valve is normal in structure. Moderate mitral valve regurgitation. No evidence of mitral stenosis.  5. The aortic valve is normal in structure. Aortic valve regurgitation is not visualized. Mild to moderate aortic valve sclerosis/calcification is present, without any evidence of aortic stenosis.  6. The inferior vena cava is normal in size with greater than 50% respiratory variability, suggesting right atrial pressure of 3 mmHg. FINDINGS  Left Ventricle: Left ventricular ejection  fraction by 3D volume is 49 %. The left ventricle has mildly decreased function. The left ventricle has no regional wall motion abnormalities. The left ventricular internal cavity size was normal in size. There is mild left ventricular hypertrophy. Left ventricular diastolic parameters are indeterminate.  LV Wall Scoring: The basal inferior segment is akinetic. Right Ventricle: The right ventricular size is normal. No increase in right ventricular wall thickness. Right ventricular systolic function is normal. There is mildly elevated pulmonary artery systolic pressure. The tricuspid regurgitant velocity is 2.85  m/s, and with an assumed right atrial pressure of 8 mmHg, the estimated right ventricular systolic pressure is 40.5 mmHg. Left Atrium: Left atrial size was normal in size. Right Atrium: Right atrial size was moderately dilated. Pericardium: There is no evidence of pericardial effusion. Mitral Valve: The mitral valve is normal in structure. Mild mitral annular calcification. Moderate mitral valve regurgitation, with eccentric laterally directed jet. No evidence of mitral valve stenosis. Tricuspid Valve: The tricuspid valve is normal in structure. Tricuspid valve regurgitation is mild . No evidence of tricuspid stenosis. Aortic Valve: The aortic valve is normal in structure. Aortic valve regurgitation is not visualized. Mild to moderate aortic valve sclerosis/calcification is present, without any evidence of aortic stenosis. Pulmonic Valve: The pulmonic valve was normal in structure. Pulmonic valve regurgitation is not visualized. No evidence of pulmonic stenosis. Aorta: The aortic root is normal in size and structure. Venous: The inferior vena cava is normal in size with greater than 50% respiratory variability, suggesting right atrial pressure of 3 mmHg. IAS/Shunts: No atrial level shunt detected by color flow Doppler.  LEFT VENTRICLE PLAX 2D LVIDd:         5.30 cm LVIDs:         4.10 cm LV PW:         1.30  cm         3D Volume EF LV IVS:        1.20 cm         LV 3D EF:    Left  ventricul                                             ar LV Volumes (MOD)                            ejection LV vol d, MOD    97.6 ml                    fraction A2C:                                        by 3D LV vol d, MOD    77.1 ml                    volume is A4C:                                        49 %. LV vol s, MOD    63.5 ml A2C: LV vol s, MOD    42.7 ml       3D Volume EF: A4C:                           3D EF:        49 % LV SV MOD A2C:   34.1 ml       LV EDV:       119 ml LV SV MOD A4C:   77.1 ml       LV ESV:       61 ml LV SV MOD BP:    38.0 ml       LV SV:        59 ml RIGHT VENTRICLE            IVC RV S prime:     6.31 cm/s  IVC diam: 2.50 cm TAPSE (M-mode): 1.1 cm LEFT ATRIUM             Index       RIGHT ATRIUM           Index LA diam:        4.20 cm 1.96 cm/m  RA Area:     23.30 cm LA Vol (A2C):   60.5 ml 28.17 ml/m RA Volume:   73.50 ml  34.23 ml/m LA Vol (A4C):   42.9 ml 19.98 ml/m LA Biplane Vol: 51.4 ml 23.93 ml/m  AORTIC VALVE LVOT Vmax:   121.00 cm/s LVOT Vmean:  75.300 cm/s LVOT VTI:    0.179 m  AORTA Ao Root diam: 3.20 cm Ao Asc diam:  2.80 cm MITRAL VALVE                 TRICUSPID VALVE MV Area (PHT): 3.86 cm      TR Peak grad:   32.5 mmHg MV Decel Time: 196 msec      TR Vmax:        285.00 cm/s MR Peak grad:    91.0 mmHg MR Mean grad:    58.0 mmHg   SHUNTS MR Vmax:  477.00 cm/s Systemic VTI: 0.18 m MR Vmean:        363.0 cm/s MR PISA:         1.01 cm MR PISA Eff ROA: 6 mm MR PISA Radius:  0.40 cm MV E velocity: 130.33 cm/s Donato Schultz MD Electronically signed by Donato Schultz MD Signature Date/Time: 09/18/2020/3:45:00 PM    Final     Cardiac Studies   See echo above  Assessment   Principal Problem:   CHF exacerbation (HCC) Active Problems:   CAD (coronary artery disease)   Atrial fibrillation with RVR (HCC)   COPD with acute exacerbation  (HCC)   Plan   Remains volume overloaded - urine output is minimal. New leukocytosis overnight- wheezy, may be related to COPD/exacerbation and probably steroids, less likely pneumonia. LVEF low normal at 49% - Will increase lasix due to suboptimal response. Transition to oral DOAC +Plavix (stop aspirin) and diltiazem today to decrease volume input - suspect low normal EF related to rate - not as likely to be new CAD. Given rising creatinine in the face of diuresis with decreased urine output, check for bladder outlet obstruction as well.   Time Spent Directly with Patient:  I have spent a total of 35 minutes with the patient reviewing hospital notes, telemetry, EKGs, labs and examining the patient as well as establishing an assessment and plan that was discussed personally with the patient.  > 50% of time was spent in direct patient care.  Length of Stay:  LOS: 2 days   Chrystie Nose, MD, [facility], FACP  [place]  [facility]  Medical Director of the [facility] Diplomate of the American Board of Clinical Lipidology Attending Cardiologist  Direct Dial: [phone]  Fax: [phone]  Website:  [email or web address] 09/19/2020, 8:13 AM

## 2020-09-20 DIAGNOSIS — I4891 Unspecified atrial fibrillation: Secondary | ICD-10-CM | POA: Diagnosis not present

## 2020-09-20 DIAGNOSIS — I5043 Acute on chronic combined systolic (congestive) and diastolic (congestive) heart failure: Secondary | ICD-10-CM | POA: Diagnosis not present

## 2020-09-20 DIAGNOSIS — I5033 Acute on chronic diastolic (congestive) heart failure: Secondary | ICD-10-CM | POA: Diagnosis not present

## 2020-09-20 LAB — BASIC METABOLIC PANEL
Anion gap: 12 (ref 5–15)
BUN: 47 mg/dL — ABNORMAL HIGH (ref 8–23)
CO2: 29 mmol/L (ref 22–32)
Calcium: 8.7 mg/dL — ABNORMAL LOW (ref 8.9–10.3)
Chloride: 95 mmol/L — ABNORMAL LOW (ref 98–111)
Creatinine, Ser: 1.37 mg/dL — ABNORMAL HIGH (ref 0.61–1.24)
GFR, Estimated: 53 mL/min — ABNORMAL LOW (ref >60)
Glucose, Bld: 159 mg/dL — ABNORMAL HIGH (ref 70–99)
Potassium: 4.5 mmol/L (ref 3.5–5.1)
Sodium: 136 mmol/L (ref 135–145)

## 2020-09-20 LAB — GLUCOSE, CAPILLARY
Glucose-Capillary: 147 mg/dL — ABNORMAL HIGH (ref 70–99)
Glucose-Capillary: 162 mg/dL — ABNORMAL HIGH (ref 70–99)
Glucose-Capillary: 210 mg/dL — ABNORMAL HIGH (ref 70–99)
Glucose-Capillary: 139 mg/dL — ABNORMAL HIGH (ref 70–99)

## 2020-09-20 LAB — CBC
HCT: 34.9 % — ABNORMAL LOW (ref 39.0–52.0)
Hemoglobin: 10.8 g/dL — ABNORMAL LOW (ref 13.0–17.0)
MCH: 27.6 pg (ref 26.0–34.0)
MCHC: 30.9 g/dL (ref 30.0–36.0)
MCV: 89 fL (ref 80.0–100.0)
Platelets: 317 10*3/uL (ref 150–400)
RBC: 3.92 MIL/uL — ABNORMAL LOW (ref 4.22–5.81)
RDW: 14.1 % (ref 11.5–15.5)
WBC: 15.6 10*3/uL — ABNORMAL HIGH (ref 4.0–10.5)
nRBC: 0 % (ref 0.0–0.2)

## 2020-09-20 MED ORDER — METOPROLOL TARTRATE 5 MG/5ML IV SOLN: 5.0000 mg | Freq: Four times a day (QID) | INTRAVENOUS | Status: DC

## 2020-09-20 MED ORDER — AMIODARONE LOAD VIA INFUSION
150.0000 mg | Freq: Once | INTRAVENOUS | Status: AC
  Administered 2020-09-20: 150 mg via INTRAVENOUS
  Filled 2020-09-20: qty 83.34

## 2020-09-20 MED ORDER — METOPROLOL TARTRATE 5 MG/5ML IV SOLN
5.0000 mg | INTRAVENOUS | Status: DC | PRN
  Administered 2020-09-20 – 2020-09-21 (×2): 5 mg via INTRAVENOUS
  Filled 2020-09-20 (×2): qty 5

## 2020-09-20 MED ORDER — METOPROLOL TARTRATE 5 MG/5ML IV SOLN: 5.0000 mg | Freq: Four times a day (QID) | INTRAVENOUS | Status: DC | PRN

## 2020-09-20 MED ORDER — DILTIAZEM HCL 30 MG PO TABS: 30.0000 mg | ORAL_TABLET | Freq: Four times a day (QID) | ORAL | Status: DC

## 2020-09-20 MED ORDER — AMIODARONE HCL IN DEXTROSE 360-4.14 MG/200ML-% IV SOLN
30.0000 mg/h | INTRAVENOUS | Status: DC
  Administered 2020-09-20 – 2020-09-28 (×17): 30 mg/h via INTRAVENOUS
  Filled 2020-09-20 (×14): qty 200

## 2020-09-20 MED ORDER — AMIODARONE HCL IN DEXTROSE 360-4.14 MG/200ML-% IV SOLN
60.0000 mg/h | INTRAVENOUS | Status: AC
  Administered 2020-09-20 (×2): 60 mg/h via INTRAVENOUS
  Filled 2020-09-20 (×2): qty 200

## 2020-09-20 MED ORDER — PHENYLEPHRINE IN HARD FAT 0.25 % RE SUPP
1.0000 | Freq: Two times a day (BID) | RECTAL | Status: DC
  Administered 2020-09-20 – 2020-10-01 (×18): 1 via RECTAL
  Filled 2020-09-20 (×25): qty 1

## 2020-09-20 NOTE — Progress Notes (Addendum)
PROGRESS NOTE    Dalton Juarez  ZOX:[id number] DOB: [birth year] DOA: 09/17/2020 PCP: Smith Robert, MD   Brief Narrative:  This 75 year old male with COPD on chronic 2 L of supplemental oxygen via [place] cannula, T2DM, CHF/ischemic cardiomyopathy, atrial fibrillation presented to the ED with 2 to 3 weeks history of progressively worsening shortness of breath, leg swelling, 10 pound weight gain, progressively worsening DOE and has been using CPAP during the daytime.  Reportedly slept with CPAP disconnected on the night prior to admission and woke up short of breath despite increased oxygen use and presented to the ED. In the ED he was tachycardic and alternating with RVR, placed on Cardizem drip, chest x-ray with bilateral patchy opacity,  placed on Levaquin, and BiPAP and transferred to [facility] long stepdown unit. Cardiology consulted.  Heart rate remained variable, started on amiodarone gtt. Plan consider TEE and DCCV early next week when he is more compensated and able to lie flat.  Assessment & Plan:   Principal Problem:   CHF exacerbation (HCC) Active Problems:   CAD (coronary artery disease)   Atrial fibrillation with RVR (HCC)   COPD with acute exacerbation (HCC)   Acute decompensation of chronic systolic CHF:  Echo from [facility] in 03/15/16 showed LVEF 35%. Repeat echo showed LVEF 49%, Continue aggressive diuresis with IV Lasix.  Continue BiPAP,   Repeat chest x-ray - unchanged. Cardiology consulted.    Monitor electrolytes closely and renal function improving with diuresis.  Monitor intake, output and daily weight as below.  Cardio recommended continue aggressive diuresis with Lasix 80 mg every 12hr.  Filed Weights   09/18/20 0422 09/19/20 0500 09/20/20 0500  Weight: 104.5 kg 104.2 kg 104.4 kg      Intake/Output Summary (Last 24 hours) at 09/20/2020 1044 Last data filed at 09/20/2020 0929 Gross per 24 hour  Intake 595 ml  Output 800 ml  Net -205 ml       Atrial fibrillation with  RVR:  Heart rate remained controlled on Cardizem gtt and metoprolol.  Started on Eliquis for anticoagulation. Cardizem drip discontinued,  transitioned to Cardizem 300 p.o. daily. Heart rate fluctuated, started on amiodarone drip.  Continue to monitor heart rate. Plan: consider TEE and DCCV early next week when he is more compensated and able to lie flat.   CAD:  Denies any chest pain., troponin borderline elevated 35>31, suspect demand mismatch.  Continue aspirin, Plavix and Lipitor 40 mg   Acute with chronic hypoxic respiratory failure-needing bipap sec.to COPD  Chronic hypoxic respiratory failure on 2 L [place]  at rest and 3l on ambulation:  Multifactorial from CHF exacerbation and component of COPD exacerbation.   Patient is on IV solumedrol - transition to PO tomorrow Cont on bronchodilators /supplemental oxygen.   OSA on CPAP Continue BIPAP as needed and at night.   Type 2 diabetes: Well controlled. HbA1c 6.3 09/17/20, Hold p.o. meds, continue sliding scale.   Morbid obesity w/ BMI 36: Will benefit with weight loss, healthy lifestyle and PCP follow-up   Hypomagnesemia : Replaced and improved.   Leukocytosis : received antibiotics x1 dose in the ED. procalcitonin less than 0.10 This could be due to IV Solu-Medrol, for COPD.    DVT prophylaxis: Eliquis Code Status: Full code. Family Communication: No family at bed side. Disposition Plan:    Status is: Inpatient  Remains inpatient appropriate because:Inpatient level of care appropriate due to severity of illness  Dispo: The patient is from: Home  Anticipated d/c is to: Home              Patient currently is not medically stable to d/c.   Difficult to place patient No  Consultants:  Cardiology  Procedures: Echo Antimicrobials:   Anti-infectives (From admission, onward)    Start     Dose/Rate Route Frequency Ordered Stop   09/17/20 1415  levofloxacin (LEVAQUIN) IVPB 500 mg        500 mg 100 mL/hr over 60  Minutes Intravenous  Once 09/17/20 1400 09/17/20 1818        Subjective: Patient was seen and examined at bedside.  Overnight events noted.   He is lying on the bed, heart rate remains uncontrolled, blood pressure is improved. Denies any chest pain shortness of breath,  still reports having cough.  Objective: Vitals:   09/20/20 0900 09/20/20 0904 09/20/20 0925 09/20/20 0930  BP: (!) 183/88 (!) 183/88  129/67  Pulse: 73 (!) 144 77 85  Resp: 17  (!) 22 (!) 26  Temp:      TempSrc:      SpO2: 91%  97% 96%  Weight:      Height:        Intake/Output Summary (Last 24 hours) at 09/20/2020 1044 Last data filed at 09/20/2020 0929 Gross per 24 hour  Intake 595 ml  Output 800 ml  Net -205 ml   Filed Weights   09/18/20 0422 09/19/20 0500 09/20/20 0500  Weight: 104.5 kg 104.2 kg 104.4 kg    Examination:  General exam: Appears comfortable, not in any distress. Respiratory system: Wheezing on auscultation, respiratory effort normal , respiratory rate 17 Cardiovascular system: S1 & S2 heard, Irregular rhythm. No JVD, murmurs, rubs, gallops or clicks. Pedal edema+ Gastrointestinal system: Abdomen is soft, nontender non distended.  no organomegaly or masses felt. Normal bowel sounds heard. Central nervous system: Alert and oriented x3, no focal neurological deficits. Extremities 2+ pitting edema noted, no cyanosis, no clubbing Skin: No rashes, lesions or ulcers Psychiatry: Judgement and insight appear normal. Mood & affect appropriate.     Data Reviewed: I have personally reviewed following labs and imaging studies  CBC: Recent Labs  Lab 09/17/20 1314 09/18/20 0312 09/18/20 0729 09/19/20 0305 09/20/20 0300  WBC 14.8* 9.6 7.8 15.1* 15.6*  NEUTROABS 9.9*  --   --   --   --   HGB 12.5* 11.3* 11.1* 12.4* 10.8*  HCT 39.2 36.5* 35.5* 39.1 34.9*  MCV 89.9 89.9 88.3 88.5 89.0  PLT 350 319 309 263 317   Basic Metabolic Panel: Recent Labs  Lab 09/17/20 1314 09/18/20 0312  09/18/20 1254 09/19/20 0305 09/20/20 0300  NA 134* 139  --  136 136  K 4.5 4.6  --  4.3 4.5  CL 97* 97*  --  95* 95*  CO2 28 27  --  29 29  GLUCOSE 132* 151*  --  163* 159*  BUN 25* 28*  --  39* 47*  CREATININE 1.21 1.28*  --  1.57* 1.37*  CALCIUM 8.2* 8.5*  --  8.5* 8.7*  MG 1.2*  --  1.6*  --   --    GFR: Estimated Creatinine Clearance: 52 mL/min (A) (by C-G formula based on SCr of 1.37 mg/dL (H)). Liver Function Tests: Recent Labs  Lab 09/17/20 1314 09/18/20 0312  AST 37 37  ALT 23 25  ALKPHOS 67 57  BILITOT 1.5* 1.1  PROT 7.3 7.1  ALBUMIN 3.8 3.6   No results for input(s):  LIPASE, AMYLASE in the last 168 hours. No results for input(s): AMMONIA in the last 168 hours. Coagulation Profile: Recent Labs  Lab 09/17/20 1314  INR 1.1   Cardiac Enzymes: No results for input(s): CKTOTAL, CKMB, CKMBINDEX, TROPONINI in the last 168 hours. BNP (last 3 results) No results for input(s): PROBNP in the last 8760 hours. HbA1C: Recent Labs    09/17/20 1314  HGBA1C 6.3*   CBG: Recent Labs  Lab 09/19/20 0823 09/19/20 1214 09/19/20 1634 09/19/20 2114 09/20/20 0757  GLUCAP 145* 174* 198* 192* 147*   Lipid Profile: No results for input(s): CHOL, HDL, LDLCALC, TRIG, CHOLHDL, LDLDIRECT in the last 72 hours. Thyroid Function Tests: Recent Labs    09/18/20 1254  TSH 0.797   Anemia Panel: No results for input(s): VITAMINB12, FOLATE, FERRITIN, TIBC, IRON, RETICCTPCT in the last 72 hours. Sepsis Labs: Recent Labs  Lab 09/17/20 1342 09/17/20 1522 09/18/20 1254  PROCALCITON  --   --  <0.10  LATICACIDVEN 1.6 1.3  --     Recent Results (from the past 240 hour(s))  Resp Panel by RT-PCR (Flu A&B, Covid) Nasopharyngeal Swab     Status: None   Collection Time: 09/17/20  6:00 PM   Specimen: Nasopharyngeal Swab; Nasopharyngeal(NP) swabs in vial transport medium  Result Value Ref Range Status   SARS Coronavirus 2 by RT PCR NEGATIVE NEGATIVE Final    Comment:  (NOTE) SARS-CoV-2 target nucleic acids are NOT DETECTED.  The SARS-CoV-2 RNA is generally detectable in upper respiratory specimens during the acute phase of infection. The lowest concentration of SARS-CoV-2 viral copies this assay can detect is 138 copies/mL. A negative result does not preclude SARS-Cov-2 infection and should not be used as the sole basis for treatment or other patient management decisions. A negative result may occur with  improper specimen collection/handling, submission of specimen other than nasopharyngeal swab, presence of viral mutation(s) within the areas targeted by this assay, and inadequate number of viral copies(<138 copies/mL). A negative result must be combined with clinical observations, patient history, and epidemiological information. The expected result is Negative.  Fact Sheet for Patients:  BloggerCourse.com  Fact Sheet for Healthcare Providers:  SeriousBroker.it  This test is no t yet approved or cleared by the [place] FDA and  has been authorized for detection and/or diagnosis of SARS-CoV-2 by FDA under an Emergency Use Authorization (EUA). This EUA will remain  in effect (meaning this test can be used) for the duration of the COVID-19 declaration under Section 564(b)(1) of the Act, 21 U.S.C.section 360bbb-3(b)(1), unless the authorization is terminated  or revoked sooner.       Influenza A by PCR NEGATIVE NEGATIVE Final   Influenza B by PCR NEGATIVE NEGATIVE Final    Comment: (NOTE) The Xpert Xpress SARS-CoV-2/FLU/RSV plus assay is intended as an aid in the diagnosis of influenza from Nasopharyngeal swab specimens and should not be used as a sole basis for treatment. Nasal washings and aspirates are unacceptable for Xpert Xpress SARS-CoV-2/FLU/RSV testing.  Fact Sheet for Patients: BloggerCourse.com  Fact Sheet for Healthcare  Providers: SeriousBroker.it  This test is not yet approved or cleared by the [place] FDA and has been authorized for detection and/or diagnosis of SARS-CoV-2 by FDA under an Emergency Use Authorization (EUA). This EUA will remain in effect (meaning this test can be used) for the duration of the COVID-19 declaration under Section 564(b)(1) of the Act, 21 U.S.C. section 360bbb-3(b)(1), unless the authorization is terminated or revoked.  Performed at [facility]  [facility], [address], [place], [place] [address]   MRSA Next Gen by PCR, Nasal     Status: Abnormal   Collection Time: 09/18/20 12:17 AM   Specimen: Nasal Mucosa; Nasal Swab  Result Value Ref Range Status   MRSA by PCR Next Gen DETECTED (A) NOT DETECTED Final    Comment: RESULT CALLED TO, READ BACK BY AND VERIFIED WITH: DANIELLE, RN @ [phone] ON 09/18/20 C VARNER (NOTE) The GeneXpert MRSA Assay (FDA approved for NASAL specimens only), is one component of a comprehensive MRSA colonization surveillance program. It is not intended to diagnose MRSA infection nor to guide or monitor treatment for MRSA infections. Test performance is not FDA approved in patients less than 9 years old. Performed at [facility], [address], [place], [place] [address]     Radiology Studies: US RENAL  Result Date: 09/19/2020 CLINICAL DATA:  Acute kidney injury. EXAM: RENAL / URINARY TRACT ULTRASOUND COMPLETE COMPARISON:  None. FINDINGS: Right Kidney: Renal measurements: 10.2 x 5.0 x 5.4 cm = volume: 143 mL. Normal parenchymal echogenicity. No hydronephrosis. No visualized stone or focal lesion. Left Kidney: Renal measurements: 10.2 x 5.0 x 5.3 cm = volume: 140 mL. Normal parenchymal echogenicity. No hydronephrosis. No visualized stone or focal lesion. Bladder: Partially distended. Appears normal for degree of bladder distention. Other: Technically challenging and limited exam due to habitus and patient  difficulty with breath hold. Incidental note of right upper quadrant ascites. The liver appears echogenic with questionable nodular contours. IMPRESSION: 1. No obstructive uropathy. Unremarkable sonographic appearance of the kidneys. 2. Incidental findings of right upper quadrant ascites. Increased liver echogenicity and questionable capsular nodularity, query cirrhosis. Recommend correlation with clinical history and cirrhosis risk factors. This is incompletely characterized on this renal exam. Electronically Signed   By: Narda Rutherford M.D.   On: 09/19/2020 16:39   DG CHEST PORT 1 VIEW  Result Date: 09/19/2020 CLINICAL DATA:  Pneumonia.  Increased shortness of breath. EXAM: PORTABLE CHEST 1 VIEW COMPARISON:  Radiograph yesterday. FINDINGS: Unchanged cardiomegaly. Stable mediastinal contours. No significant change in streaky opacity at the right lung base and blunting of the costophrenic angle. No new airspace disease. There is mild peribronchial thickening. No pneumothorax. Stable osseous structures. IMPRESSION: 1. No significant change in streaky opacity at the right lung base and blunting of the costophrenic angle. Findings may represent pneumonia or underlying scarring. 2. Stable cardiomegaly. 3. Peribronchial thickening may be bronchitic or congestive. Electronically Signed   By: Narda Rutherford M.D.   On: 09/19/2020 16:37   ECHOCARDIOGRAM COMPLETE  Result Date: 09/18/2020    ECHOCARDIOGRAM REPORT   Patient Name:   Dalton Juarez Date of Exam: 09/18/2020 Medical Rec #:  [id number]     Height:       67.0 in Accession #:    [id number]    Weight:       230.4 lb Date of Birth:  02/02/1946     BSA:          2.148 m Patient Age:    75 years      BP:           115/67 mmHg Patient Gender: M             HR:           107 bpm. Exam Location:  Inpatient Procedure: 2D Echo, 3D Echo, Cardiac Doppler and Color Doppler Indications:    I50.40* Unspecified combined systolic (congestive) and diastolic                  (  congestive) heart failure  History:        Patient has no prior history of Echocardiogram examinations. CHF                 and Cardiomyopathy, CAD, Abnormal ECG, COPD, Arrythmias:Atrial                 Fibrillation, Tachycardia and RBBB; Risk Factors:Former Smoker,                 Hypertension, Dyslipidemia and Diabetes.  Sonographer:    Sheralyn Boatmanina West RDCS Referring Phys: [id number] STEPHEN K CHIU  Sonographer Comments: Technically difficult study due to poor echo windows. Image acquisition challenging due to patient body habitus. IMPRESSIONS  1. Left ventricular ejection fraction by 3D volume is 49 %. The left ventricle has mildly decreased function. The left ventricle has no regional wall motion abnormalities. There is mild left ventricular hypertrophy. Left ventricular diastolic parameters  are indeterminate.  2. Right ventricular systolic function is normal. The right ventricular size is normal. There is mildly elevated pulmonary artery systolic pressure. The estimated right ventricular systolic pressure is 40.5 mmHg.  3. Right atrial size was moderately dilated.  4. The mitral valve is normal in structure. Moderate mitral valve regurgitation. No evidence of mitral stenosis.  5. The aortic valve is normal in structure. Aortic valve regurgitation is not visualized. Mild to moderate aortic valve sclerosis/calcification is present, without any evidence of aortic stenosis.  6. The inferior vena cava is normal in size with greater than 50% respiratory variability, suggesting right atrial pressure of 3 mmHg. FINDINGS  Left Ventricle: Left ventricular ejection fraction by 3D volume is 49 %. The left ventricle has mildly decreased function. The left ventricle has no regional wall motion abnormalities. The left ventricular internal cavity size was normal in size. There is mild left ventricular hypertrophy. Left ventricular diastolic parameters are indeterminate.  LV Wall Scoring: The basal inferior segment is akinetic. Right  Ventricle: The right ventricular size is normal. No increase in right ventricular wall thickness. Right ventricular systolic function is normal. There is mildly elevated pulmonary artery systolic pressure. The tricuspid regurgitant velocity is 2.85  m/s, and with an assumed right atrial pressure of 8 mmHg, the estimated right ventricular systolic pressure is 40.5 mmHg. Left Atrium: Left atrial size was normal in size. Right Atrium: Right atrial size was moderately dilated. Pericardium: There is no evidence of pericardial effusion. Mitral Valve: The mitral valve is normal in structure. Mild mitral annular calcification. Moderate mitral valve regurgitation, with eccentric laterally directed jet. No evidence of mitral valve stenosis. Tricuspid Valve: The tricuspid valve is normal in structure. Tricuspid valve regurgitation is mild . No evidence of tricuspid stenosis. Aortic Valve: The aortic valve is normal in structure. Aortic valve regurgitation is not visualized. Mild to moderate aortic valve sclerosis/calcification is present, without any evidence of aortic stenosis. Pulmonic Valve: The pulmonic valve was normal in structure. Pulmonic valve regurgitation is not visualized. No evidence of pulmonic stenosis. Aorta: The aortic root is normal in size and structure. Venous: The inferior vena cava is normal in size with greater than 50% respiratory variability, suggesting right atrial pressure of 3 mmHg. IAS/Shunts: No atrial level shunt detected by color flow Doppler.  LEFT VENTRICLE PLAX 2D LVIDd:         5.30 cm LVIDs:         4.10 cm LV PW:         1.30 cm  3D Volume EF LV IVS:        1.20 cm         LV 3D EF:    Left                                             ventricul                                             ar LV Volumes (MOD)                            ejection LV vol d, MOD    97.6 ml                    fraction A2C:                                        by 3D LV vol d, MOD    77.1 ml                     volume is A4C:                                        49 %. LV vol s, MOD    63.5 ml A2C: LV vol s, MOD    42.7 ml       3D Volume EF: A4C:                           3D EF:        49 % LV SV MOD A2C:   34.1 ml       LV EDV:       119 ml LV SV MOD A4C:   77.1 ml       LV ESV:       61 ml LV SV MOD BP:    38.0 ml       LV SV:        59 ml RIGHT VENTRICLE            IVC RV S prime:     6.31 cm/s  IVC diam: 2.50 cm TAPSE (M-mode): 1.1 cm LEFT ATRIUM             Index       RIGHT ATRIUM           Index LA diam:        4.20 cm 1.96 cm/m  RA Area:     23.30 cm LA Vol (A2C):   60.5 ml 28.17 ml/m RA Volume:   73.50 ml  34.23 ml/m LA Vol (A4C):   42.9 ml 19.98 ml/m LA Biplane Vol: 51.4 ml 23.93 ml/m  AORTIC VALVE LVOT Vmax:   121.00 cm/s LVOT Vmean:  75.300 cm/s LVOT VTI:    0.179 m  AORTA Ao Root diam: 3.20 cm Ao Asc diam:  2.80 cm MITRAL VALVE  TRICUSPID VALVE MV Area (PHT): 3.86 cm      TR Peak grad:   32.5 mmHg MV Decel Time: 196 msec      TR Vmax:        285.00 cm/s MR Peak grad:    91.0 mmHg MR Mean grad:    58.0 mmHg   SHUNTS MR Vmax:         477.00 cm/s Systemic VTI: 0.18 m MR Vmean:        363.0 cm/s MR PISA:         1.01 cm MR PISA Eff ROA: 6 mm MR PISA Radius:  0.40 cm MV E velocity: 130.33 cm/s Donato Schultz MD Electronically signed by Donato Schultz MD Signature Date/Time: 09/18/2020/3:45:00 PM    Final     Scheduled Meds:  apixaban  5 mg Oral BID   atorvastatin  40 mg Oral Daily   chlorhexidine  15 mL Mouth Rinse BID   Chlorhexidine Gluconate Cloth  6 each Topical Daily   cholecalciferol  2,000 Units Oral Daily   clopidogrel  75 mg Oral Daily   dorzolamide  1 drop Left Eye BID   furosemide  80 mg Intravenous BID   gabapentin  300 mg Oral TID   guaiFENesin  600 mg Oral BID   insulin aspart  0-15 Units Subcutaneous TID WC   insulin aspart  0-5 Units Subcutaneous QHS   ipratropium-albuterol  3 mL Nebulization Q6H   mouth rinse  15 mL Mouth Rinse q12n4p   methylPREDNISolone  (SOLU-MEDROL) injection  40 mg Intravenous Daily   metoprolol tartrate  25 mg Oral TID   mometasone-formoterol  2 puff Inhalation BID   multivitamin with minerals  1 tablet Oral Daily   mupirocin ointment  1 application Nasal BID   pantoprazole  40 mg Oral Daily   Continuous Infusions:  sodium chloride 10 mL/hr at 09/20/20 0929   amiodarone 60 mg/hr (09/20/20 0929)   Followed by   amiodarone       LOS: 3 days    Time spent: 35 mins    Maezie Justin, MD [facility]   If 7PM-7AM, please contact night-coverage prog

## 2020-09-20 NOTE — Progress Notes (Signed)
Patient converted back into afib RVR this AM with HR up to 170's. Cardiologist at bedside and ordered amiodarone gtt with 150 mg bolus. [facility] also notified by RN and rounded on pt.   Patient has also been very dyspneic this AM and requested to be placed back on bipap by this RN. 80 mg lasix given IVP per orders as well.   HR has improved to 118 bpm, irregular. And pt reports less dyspnea at this time. RN will continue to carefully monitor pt.

## 2020-09-20 NOTE — Plan of Care (Signed)
  Problem: Education: Goal: Knowledge of General Education information will improve Description: Including pain rating scale, medication(s)/side effects and non-pharmacologic comfort measures Outcome: Progressing   Problem: Health Behavior/Discharge Planning: Goal: Ability to manage health-related needs will improve Outcome: Progressing   Problem: Clinical Measurements: Goal: Ability to maintain clinical measurements within normal limits will improve Outcome: Progressing Goal: Respiratory complications will improve Outcome: Progressing   Problem: Activity: Goal: Risk for activity intolerance will decrease Outcome: Progressing   Problem: Nutrition: Goal: Adequate nutrition will be maintained Outcome: Progressing   Problem: Coping: Goal: Level of anxiety will decrease Outcome: Progressing   Problem: Education: Goal: Knowledge of disease or condition will improve Outcome: Progressing Goal: Knowledge of the prescribed therapeutic regimen will improve Outcome: Progressing   Problem: Respiratory: Goal: Ability to maintain a clear airway will improve Outcome: Progressing Goal: Levels of oxygenation will improve Outcome: Progressing Goal: Ability to maintain adequate ventilation will improve Outcome: Progressing   Cindy S. Clelia Croft BSN, RN, CCRP 09/20/2020 2:01 AM

## 2020-09-20 NOTE — Progress Notes (Signed)
After eating a late lunch patient went into A-fib with HR sustained in 150's-160's. Cardiology On-Call and Dr. Lucianne Muss Paged. Lucianne Muss returned call and says he will put in orders.

## 2020-09-20 NOTE — Progress Notes (Signed)
DAILY PROGRESS NOTE   Patient Name: Dalton Juarez Date of Encounter: 09/20/2020 Cardiologist: Thurmon FairMihai Croitoru, MD  Chief Complaint   Short of breath  Patient Profile   Dalton Juarez is a 75 y.o. male with a history of CAD, ischemic cardiomyopathy/chronic systolic CHF, paroxysmal atrial fibrillation not on anticoagulation at home, iatrogenic hemothorax from bedside thoracentesis in 03/2016 s/p VATS decortication with [facility], COPD on home O2, hypertension, hyperlipidemia, type 2 diabetes, GERD, and prior tobacco use (quit 4 years ago) who is being seen 09/18/2020 for the evaluation of paroxysmal atrial fibrillation at the request of Dr. Rhona Leavenshiu.  Subjective   No hallucinations overnight., Almost 600 cc negative recorded overnight  on increased dose lasix - creatinine is improved today at 1.37. Renal US showed no obstruction - bladder scan showed no retention - some ascites noted.  Afib rates are not well controlled after switching to po diltiazem and TID metoprolol - ultimately, I think we need to get him back in sinus rhythm - his afib rates may also be driven by the steroids he is getting for COPD exacerbation.  Objective   Vitals:   09/20/20 0700 09/20/20 0800 09/20/20 0827 09/20/20 0828  BP:  126/76    Pulse: 93 (!) 105    Resp: 17 20    Temp:  (!) 97.4 F (36.3 C)    TempSrc:  Axillary    SpO2: 97% 99% 95% 100%  Weight:      Height:        Intake/Output Summary (Last 24 hours) at 09/20/2020 0836 Last data filed at 09/20/2020 0600 Gross per 24 hour  Intake 348.05 ml  Output 925 ml  Net -576.95 ml   Filed Weights   09/18/20 0422 09/19/20 0500 09/20/20 0500  Weight: 104.5 kg 104.2 kg 104.4 kg    Physical Exam   General appearance: alert, no distress, and on CPAP Neck: no carotid bruit, no JVD, and thyroid not enlarged, symmetric, no tenderness/mass/nodules Lungs: wheezes bilaterally Heart: irregularly irregular rhythm Abdomen: soft, non-tender;  bowel sounds normal; no masses,  no organomegaly Extremities: edema 2+ bilateral pitting Pulses: 2+ and symmetric Skin: Skin color, texture, turgor normal. No rashes or lesions Neurologic: Grossly normal Psych: Pleasant  Inpatient Medications    Scheduled Meds:  apixaban  5 mg Oral BID   atorvastatin  40 mg Oral Daily   chlorhexidine  15 mL Mouth Rinse BID   Chlorhexidine Gluconate Cloth  6 each Topical Daily   cholecalciferol  2,000 Units Oral Daily   clopidogrel  75 mg Oral Daily   diltiazem  300 mg Oral Daily   dorzolamide  1 drop Left Eye BID   furosemide  80 mg Intravenous BID   gabapentin  300 mg Oral TID   guaiFENesin  600 mg Oral BID   insulin aspart  0-15 Units Subcutaneous TID WC   insulin aspart  0-5 Units Subcutaneous QHS   ipratropium-albuterol  3 mL Nebulization Q6H   mouth rinse  15 mL Mouth Rinse q12n4p   methylPREDNISolone (SOLU-MEDROL) injection  40 mg Intravenous Daily   metoprolol tartrate  25 mg Oral TID   mometasone-formoterol  2 puff Inhalation BID   multivitamin with minerals  1 tablet Oral Daily   mupirocin ointment  1 application Nasal BID   pantoprazole  40 mg Oral Daily    Continuous Infusions:  sodium chloride 10 mL/hr at 09/20/20 0600    PRN Meds: sodium chloride, acetaminophen **OR** acetaminophen, ipratropium-albuterol, nitroGLYCERIN  Labs   Results for orders placed or performed during the hospital encounter of 09/17/20 (from the past 48 hour(s))  Glucose, capillary     Status: Abnormal   Collection Time: 09/18/20 12:49 PM  Result Value Ref Range   Glucose-Capillary 145 (H) 70 - 99 mg/dL    Comment: Glucose reference range applies only to samples taken after fasting for at least 8 hours.  Magnesium     Status: Abnormal   Collection Time: 09/18/20 12:54 PM  Result Value Ref Range   Magnesium 1.6 (L) 1.7 - 2.4 mg/dL    Comment: Performed at [facility], [address] [place], [place] [address]  Procalcitonin -  Baseline     Status: None   Collection Time: 09/18/20 12:54 PM  Result Value Ref Range   Procalcitonin <0.10 ng/mL    Comment:        Interpretation: PCT (Procalcitonin) <= 0.5 ng/mL: Systemic infection (sepsis) is not likely. Local bacterial infection is possible. (NOTE)       Sepsis PCT Algorithm           Lower Respiratory Tract                                      Infection PCT Algorithm    ----------------------------     ----------------------------         PCT < 0.25 ng/mL                PCT < 0.10 ng/mL          Strongly encourage             Strongly discourage   discontinuation of antibiotics    initiation of antibiotics    ----------------------------     -----------------------------       PCT 0.25 - 0.50 ng/mL            PCT 0.10 - 0.25 ng/mL               OR       >80% decrease in PCT            Discourage initiation of                                            antibiotics      Encourage discontinuation           of antibiotics    ----------------------------     -----------------------------         PCT >= 0.50 ng/mL              PCT 0.26 - 0.50 ng/mL               AND        <80% decrease in PCT             Encourage initiation of                                             antibiotics       Encourage continuation           of antibiotics    ----------------------------     -----------------------------  PCT >= 0.50 ng/mL                  PCT > 0.50 ng/mL               AND         increase in PCT                  Strongly encourage                                      initiation of antibiotics    Strongly encourage escalation           of antibiotics                                     -----------------------------                                           PCT <= 0.25 ng/mL                                                 OR                                        > 80% decrease in PCT                                      Discontinue / Do not initiate                                              antibiotics  Performed at [facility], [address] [place], [place] [address]   TSH     Status: None   Collection Time: 09/18/20 12:54 PM  Result Value Ref Range   TSH 0.797 0.350 - 4.500 uIU/mL    Comment: Performed by a 3rd Generation assay with a functional sensitivity of <=0.01 uIU/mL. Performed at [facility], [address], [place], [place] [address]   Glucose, capillary     Status: Abnormal   Collection Time: 09/18/20  4:53 PM  Result Value Ref Range   Glucose-Capillary 159 (H) 70 - 99 mg/dL    Comment: Glucose reference range applies only to samples taken after fasting for at least 8 hours.  Glucose, capillary     Status: Abnormal   Collection Time: 09/18/20  6:13 PM  Result Value Ref Range   Glucose-Capillary 175 (H) 70 - 99 mg/dL    Comment: Glucose reference range applies only to samples taken after fasting for at least 8 hours.  Glucose, capillary     Status: Abnormal   Collection Time: 09/18/20  9:44 PM  Result Value Ref Range   Glucose-Capillary 180 (H) 70 - 99 mg/dL  Comment: Glucose reference range applies only to samples taken after fasting for at least 8 hours.  Heparin level (unfractionated)     Status: Abnormal   Collection Time: 09/19/20 12:15 AM  Result Value Ref Range   Heparin Unfractionated 0.88 (H) 0.30 - 0.70 IU/mL    Comment: (NOTE) The clinical reportable range upper limit is being lowered to >1.10 to align with the FDA approved guidance for the current laboratory assay.  If heparin results are below expected values, and patient dosage has  been confirmed, suggest follow up testing of antithrombin III levels. Performed at [facility], [address], [place], [place] [address]   Basic metabolic panel     Status: Abnormal   Collection Time: 09/19/20  3:05 AM  Result Value Ref Range   Sodium 136 135 - 145 mmol/L   Potassium 4.3 3.5 - 5.1  mmol/L   Chloride 95 (L) 98 - 111 mmol/L   CO2 29 22 - 32 mmol/L   Glucose, Bld 163 (H) 70 - 99 mg/dL    Comment: Glucose reference range applies only to samples taken after fasting for at least 8 hours.   BUN 39 (H) 8 - 23 mg/dL   Creatinine, Ser 6.04 (H) 0.61 - 1.24 mg/dL   Calcium 8.5 (L) 8.9 - 10.3 mg/dL   GFR, Estimated 45 (L) >60 mL/min    Comment: (NOTE) Calculated using the CKD-EPI Creatinine Equation (2021)    Anion gap 12 5 - 15    Comment: Performed at [facility], [address] [place], [place] [address]  CBC     Status: Abnormal   Collection Time: 09/19/20  3:05 AM  Result Value Ref Range   WBC 15.1 (H) 4.0 - 10.5 K/uL   RBC 4.42 4.22 - 5.81 MIL/uL   Hemoglobin 12.4 (L) 13.0 - 17.0 g/dL   HCT 11.9 14.7 - 82.9 %   MCV 88.5 80.0 - 100.0 fL   MCH 28.1 26.0 - 34.0 pg   MCHC 31.7 30.0 - 36.0 g/dL   RDW 56.2 13.0 - 86.5 %   Platelets 263 150 - 400 K/uL   nRBC 0.0 0.0 - 0.2 %    Comment: Performed at [facility], [address] [place], [place] [address]  Glucose, capillary     Status: Abnormal   Collection Time: 09/19/20  8:23 AM  Result Value Ref Range   Glucose-Capillary 145 (H) 70 - 99 mg/dL    Comment: Glucose reference range applies only to samples taken after fasting for at least 8 hours.  Glucose, capillary     Status: Abnormal   Collection Time: 09/19/20 12:14 PM  Result Value Ref Range   Glucose-Capillary 174 (H) 70 - 99 mg/dL    Comment: Glucose reference range applies only to samples taken after fasting for at least 8 hours.  Glucose, capillary     Status: Abnormal   Collection Time: 09/19/20  4:34 PM  Result Value Ref Range   Glucose-Capillary 198 (H) 70 - 99 mg/dL    Comment: Glucose reference range applies only to samples taken after fasting for at least 8 hours.  Glucose, capillary     Status: Abnormal   Collection Time: 09/19/20  9:14 PM  Result Value Ref Range   Glucose-Capillary 192 (H) 70 - 99 mg/dL     Comment: Glucose reference range applies only to samples taken after fasting for at least 8 hours.  Basic metabolic panel  Status: Abnormal   Collection Time: 09/20/20  3:00 AM  Result Value Ref Range   Sodium 136 135 - 145 mmol/L   Potassium 4.5 3.5 - 5.1 mmol/L   Chloride 95 (L) 98 - 111 mmol/L   CO2 29 22 - 32 mmol/L   Glucose, Bld 159 (H) 70 - 99 mg/dL    Comment: Glucose reference range applies only to samples taken after fasting for at least 8 hours.   BUN 47 (H) 8 - 23 mg/dL   Creatinine, Ser 1.61 (H) 0.61 - 1.24 mg/dL   Calcium 8.7 (L) 8.9 - 10.3 mg/dL   GFR, Estimated 53 (L) >60 mL/min    Comment: (NOTE) Calculated using the CKD-EPI Creatinine Equation (2021)    Anion gap 12 5 - 15    Comment: Performed at [facility], [address], [place], [place] [address]  CBC     Status: Abnormal   Collection Time: 09/20/20  3:00 AM  Result Value Ref Range   WBC 15.6 (H) 4.0 - 10.5 K/uL   RBC 3.92 (L) 4.22 - 5.81 MIL/uL   Hemoglobin 10.8 (L) 13.0 - 17.0 g/dL   HCT 54.0 (L) 98.1 - 19.1 %   MCV 89.0 80.0 - 100.0 fL   MCH 27.6 26.0 - 34.0 pg   MCHC 30.9 30.0 - 36.0 g/dL   RDW 47.8 29.5 - 62.1 %   Platelets 317 150 - 400 K/uL   nRBC 0.0 0.0 - 0.2 %    Comment: Performed at [facility], [address] [place], [place] [address]  Glucose, capillary     Status: Abnormal   Collection Time: 09/20/20  7:57 AM  Result Value Ref Range   Glucose-Capillary 147 (H) 70 - 99 mg/dL    Comment: Glucose reference range applies only to samples taken after fasting for at least 8 hours.    ECG   NA  Telemetry   AFib with RVR in the 100-110 range  - Personally Reviewed  Radiology    US RENAL  Result Date: 09/19/2020 CLINICAL DATA:  Acute kidney injury. EXAM: RENAL / URINARY TRACT ULTRASOUND COMPLETE COMPARISON:  None. FINDINGS: Right Kidney: Renal measurements: 10.2 x 5.0 x 5.4 cm = volume: 143 mL. Normal parenchymal echogenicity. No  hydronephrosis. No visualized stone or focal lesion. Left Kidney: Renal measurements: 10.2 x 5.0 x 5.3 cm = volume: 140 mL. Normal parenchymal echogenicity. No hydronephrosis. No visualized stone or focal lesion. Bladder: Partially distended. Appears normal for degree of bladder distention. Other: Technically challenging and limited exam due to habitus and patient difficulty with breath hold. Incidental note of right upper quadrant ascites. The liver appears echogenic with questionable nodular contours. IMPRESSION: 1. No obstructive uropathy. Unremarkable sonographic appearance of the kidneys. 2. Incidental findings of right upper quadrant ascites. Increased liver echogenicity and questionable capsular nodularity, query cirrhosis. Recommend correlation with clinical history and cirrhosis risk factors. This is incompletely characterized on this renal exam. Electronically Signed   By: Narda Rutherford M.D.   On: 09/19/2020 16:39   DG CHEST PORT 1 VIEW  Result Date: 09/19/2020 CLINICAL DATA:  Pneumonia.  Increased shortness of breath. EXAM: PORTABLE CHEST 1 VIEW COMPARISON:  Radiograph yesterday. FINDINGS: Unchanged cardiomegaly. Stable mediastinal contours. No significant change in streaky opacity at the right lung base and blunting of the costophrenic angle. No new airspace disease. There is mild peribronchial thickening. No pneumothorax. Stable osseous structures. IMPRESSION: 1. No significant change in streaky opacity at the right  lung base and blunting of the costophrenic angle. Findings may represent pneumonia or underlying scarring. 2. Stable cardiomegaly. 3. Peribronchial thickening may be bronchitic or congestive. Electronically Signed   By: Narda Rutherford M.D.   On: 09/19/2020 16:37   ECHOCARDIOGRAM COMPLETE  Result Date: 09/18/2020    ECHOCARDIOGRAM REPORT   Patient Name:   Dalton Juarez Date of Exam: 09/18/2020 Medical Rec #:  [id number]     Height:       67.0 in Accession #:    [id number]     Weight:       230.4 lb Date of Birth:  [birth year]     BSA:          2.148 m Patient Age:    75 years      BP:           115/67 mmHg Patient Gender: M             HR:           107 bpm. Exam Location:  Inpatient Procedure: 2D Echo, 3D Echo, Cardiac Doppler and Color Doppler Indications:    I50.40* Unspecified combined systolic (congestive) and diastolic                 (congestive) heart failure  History:        Patient has no prior history of Echocardiogram examinations. CHF                 and Cardiomyopathy, CAD, Abnormal ECG, COPD, Arrythmias:Atrial                 Fibrillation, Tachycardia and RBBB; Risk Factors:Former Smoker,                 Hypertension, Dyslipidemia and Diabetes.  Sonographer:    Sheralyn Boatman RDCS Referring Phys: [id number] STEPHEN K CHIU  Sonographer Comments: Technically difficult study due to poor echo windows. Image acquisition challenging due to patient body habitus. IMPRESSIONS  1. Left ventricular ejection fraction by 3D volume is 49 %. The left ventricle has mildly decreased function. The left ventricle has no regional wall motion abnormalities. There is mild left ventricular hypertrophy. Left ventricular diastolic parameters  are indeterminate.  2. Right ventricular systolic function is normal. The right ventricular size is normal. There is mildly elevated pulmonary artery systolic pressure. The estimated right ventricular systolic pressure is 40.5 mmHg.  3. Right atrial size was moderately dilated.  4. The mitral valve is normal in structure. Moderate mitral valve regurgitation. No evidence of mitral stenosis.  5. The aortic valve is normal in structure. Aortic valve regurgitation is not visualized. Mild to moderate aortic valve sclerosis/calcification is present, without any evidence of aortic stenosis.  6. The inferior vena cava is normal in size with greater than 50% respiratory variability, suggesting right atrial pressure of 3 mmHg. FINDINGS  Left Ventricle: Left ventricular ejection  fraction by 3D volume is 49 %. The left ventricle has mildly decreased function. The left ventricle has no regional wall motion abnormalities. The left ventricular internal cavity size was normal in size. There is mild left ventricular hypertrophy. Left ventricular diastolic parameters are indeterminate.  LV Wall Scoring: The basal inferior segment is akinetic. Right Ventricle: The right ventricular size is normal. No increase in right ventricular wall thickness. Right ventricular systolic function is normal. There is mildly elevated pulmonary artery systolic pressure. The tricuspid regurgitant velocity is 2.85  m/s, and with an assumed right atrial pressure of 8 mmHg, the estimated  right ventricular systolic pressure is 40.5 mmHg. Left Atrium: Left atrial size was normal in size. Right Atrium: Right atrial size was moderately dilated. Pericardium: There is no evidence of pericardial effusion. Mitral Valve: The mitral valve is normal in structure. Mild mitral annular calcification. Moderate mitral valve regurgitation, with eccentric laterally directed jet. No evidence of mitral valve stenosis. Tricuspid Valve: The tricuspid valve is normal in structure. Tricuspid valve regurgitation is mild . No evidence of tricuspid stenosis. Aortic Valve: The aortic valve is normal in structure. Aortic valve regurgitation is not visualized. Mild to moderate aortic valve sclerosis/calcification is present, without any evidence of aortic stenosis. Pulmonic Valve: The pulmonic valve was normal in structure. Pulmonic valve regurgitation is not visualized. No evidence of pulmonic stenosis. Aorta: The aortic root is normal in size and structure. Venous: The inferior vena cava is normal in size with greater than 50% respiratory variability, suggesting right atrial pressure of 3 mmHg. IAS/Shunts: No atrial level shunt detected by color flow Doppler.  LEFT VENTRICLE PLAX 2D LVIDd:         5.30 cm LVIDs:         4.10 cm LV PW:         1.30  cm         3D Volume EF LV IVS:        1.20 cm         LV 3D EF:    Left                                             ventricul                                             ar LV Volumes (MOD)                            ejection LV vol d, MOD    97.6 ml                    fraction A2C:                                        by 3D LV vol d, MOD    77.1 ml                    volume is A4C:                                        49 %. LV vol s, MOD    63.5 ml A2C: LV vol s, MOD    42.7 ml       3D Volume EF: A4C:                           3D EF:        49 % LV SV MOD A2C:   34.1 ml       LV EDV:  119 ml LV SV MOD A4C:   77.1 ml       LV ESV:       61 ml LV SV MOD BP:    38.0 ml       LV SV:        59 ml RIGHT VENTRICLE            IVC RV S prime:     6.31 cm/s  IVC diam: 2.50 cm TAPSE (M-mode): 1.1 cm LEFT ATRIUM             Index       RIGHT ATRIUM           Index LA diam:        4.20 cm 1.96 cm/m  RA Area:     23.30 cm LA Vol (A2C):   60.5 ml 28.17 ml/m RA Volume:   73.50 ml  34.23 ml/m LA Vol (A4C):   42.9 ml 19.98 ml/m LA Biplane Vol: 51.4 ml 23.93 ml/m  AORTIC VALVE LVOT Vmax:   121.00 cm/s LVOT Vmean:  75.300 cm/s LVOT VTI:    0.179 m  AORTA Ao Root diam: 3.20 cm Ao Asc diam:  2.80 cm MITRAL VALVE                 TRICUSPID VALVE MV Area (PHT): 3.86 cm      TR Peak grad:   32.5 mmHg MV Decel Time: 196 msec      TR Vmax:        285.00 cm/s MR Peak grad:    91.0 mmHg MR Mean grad:    58.0 mmHg   SHUNTS MR Vmax:         477.00 cm/s Systemic VTI: 0.18 m MR Vmean:        363.0 cm/s MR PISA:         1.01 cm MR PISA Eff ROA: 6 mm MR PISA Radius:  0.40 cm MV E velocity: 130.33 cm/s Donato Schultz MD Electronically signed by Donato Schultz MD Signature Date/Time: 09/18/2020/3:45:00 PM    Final     Cardiac Studies   See echo above  Assessment   Principal Problem:   CHF exacerbation (HCC) Active Problems:   CAD (coronary artery disease)   Atrial fibrillation with RVR (HCC)   COPD with acute exacerbation  (HCC) NSVT  Plan   Some progress with diuresis overnight on higher dose lasix -remains volume up. Creatinine improved with diuresis. Rate-control is suboptimal after switching to oral therapy - may not be absorbing well. Also had some NSVT overnight. Will start IV amiodarone (stop diltiazem to allow bp room, continue metoprolol) - probably should work for TEE/DCCV this coming week when he is more compensated and able to lie flat.  Time Spent Directly with Patient:  I have spent a total of 35 minutes with the patient reviewing hospital notes, telemetry, EKGs, labs and examining the patient as well as establishing an assessment and plan that was discussed personally with the patient.  > 50% of time was spent in direct patient care.  Length of Stay:  LOS: 3 days   Chrystie Nose, MD, [facility], FACP  [place]  [facility]  Medical Director of the [facility] Lipid [facility] &  Cardiovascular Risk Reduction Clinic Diplomate of the American Board of Clinical Lipidology Attending Cardiologist  Direct Dial: [phone]  Fax: [phone]  Website:  [email or web address] 09/20/2020, 8:36 AM

## 2020-09-21 DIAGNOSIS — I5023 Acute on chronic systolic (congestive) heart failure: Secondary | ICD-10-CM | POA: Diagnosis not present

## 2020-09-21 DIAGNOSIS — I5043 Acute on chronic combined systolic (congestive) and diastolic (congestive) heart failure: Secondary | ICD-10-CM | POA: Diagnosis not present

## 2020-09-21 LAB — PROCALCITONIN: Procalcitonin: <0.1 ng/mL

## 2020-09-21 LAB — BASIC METABOLIC PANEL
Anion gap: 10 (ref 5–15)
BUN: 50 mg/dL — ABNORMAL HIGH (ref 8–23)
CO2: 31 mmol/L (ref 22–32)
Calcium: 8.8 mg/dL — ABNORMAL LOW (ref 8.9–10.3)
Chloride: 96 mmol/L — ABNORMAL LOW (ref 98–111)
Creatinine, Ser: 1.28 mg/dL — ABNORMAL HIGH (ref 0.61–1.24)
GFR, Estimated: 58 mL/min — ABNORMAL LOW (ref >60)
Glucose, Bld: 147 mg/dL — ABNORMAL HIGH (ref 70–99)
Potassium: 4.4 mmol/L (ref 3.5–5.1)
Sodium: 137 mmol/L (ref 135–145)

## 2020-09-21 LAB — CBC
HCT: 36.4 % — ABNORMAL LOW (ref 39.0–52.0)
Hemoglobin: 11.3 g/dL — ABNORMAL LOW (ref 13.0–17.0)
MCH: 27.8 pg (ref 26.0–34.0)
MCHC: 31 g/dL (ref 30.0–36.0)
MCV: 89.4 fL (ref 80.0–100.0)
Platelets: 302 10*3/uL (ref 150–400)
RBC: 4.07 MIL/uL — ABNORMAL LOW (ref 4.22–5.81)
RDW: 14.1 % (ref 11.5–15.5)
WBC: 15.5 10*3/uL — ABNORMAL HIGH (ref 4.0–10.5)
nRBC: 0 % (ref 0.0–0.2)

## 2020-09-21 LAB — MAGNESIUM: Magnesium: 2.2 mg/dL (ref 1.7–2.4)

## 2020-09-21 LAB — GLUCOSE, CAPILLARY
Glucose-Capillary: 139 mg/dL — ABNORMAL HIGH (ref 70–99)
Glucose-Capillary: 146 mg/dL — ABNORMAL HIGH (ref 70–99)
Glucose-Capillary: 178 mg/dL — ABNORMAL HIGH (ref 70–99)
Glucose-Capillary: 137 mg/dL — ABNORMAL HIGH (ref 70–99)

## 2020-09-21 MED ORDER — AMIODARONE LOAD VIA INFUSION
150.0000 mg | Freq: Once | INTRAVENOUS | Status: AC
  Administered 2020-09-21: 150 mg via INTRAVENOUS
  Filled 2020-09-21: qty 83.34

## 2020-09-21 MED ORDER — WITCH HAZEL-GLYCERIN EX PADS
MEDICATED_PAD | CUTANEOUS | Status: DC | PRN
  Filled 2020-09-21: qty 100

## 2020-09-21 MED ORDER — METOPROLOL TARTRATE 25 MG PO TABS
50.0000 mg | ORAL_TABLET | Freq: Three times a day (TID) | ORAL | Status: DC
  Administered 2020-09-21 – 2020-09-22 (×4): 50 mg via ORAL
  Filled 2020-09-21 (×4): qty 2

## 2020-09-21 MED ORDER — FUROSEMIDE 10 MG/ML IJ SOLN
80.0000 mg | Freq: Three times a day (TID) | INTRAMUSCULAR | Status: DC
  Administered 2020-09-21 – 2020-09-27 (×18): 80 mg via INTRAVENOUS
  Filled 2020-09-21 (×18): qty 8

## 2020-09-21 NOTE — Progress Notes (Signed)
   Notified that patient heart rates still in the 130s to 140s. Will rebolus IV Amiodarone (150mg ).   , PA-C 09/21/2020 4:10 PM

## 2020-09-21 NOTE — Progress Notes (Addendum)
Progress Note  Patient Name: Dalton Juarez Date of Encounter: 09/21/2020  [facility] Cardiologist: Thurmon FairMihai Croitoru, MD   Subjective   No acute overnight events. Patient looks like he is breathing worse compared to when I saw him yesterday. He uses BiPAP at night for his sleep apnea. This was just removed and he has a significant wet sounding (but non productive) cough and is requesting he put this back on. Still in atrial fibrillation with rates in the 150s. No palpitations with this. No chest pain.  Inpatient Medications    Scheduled Meds:  apixaban  5 mg Oral BID   atorvastatin  40 mg Oral Daily   chlorhexidine  15 mL Mouth Rinse BID   Chlorhexidine Gluconate Cloth  6 each Topical Daily   cholecalciferol  2,000 Units Oral Daily   clopidogrel  75 mg Oral Daily   dorzolamide  1 drop Left Eye BID   furosemide  80 mg Intravenous BID   gabapentin  300 mg Oral TID   guaiFENesin  600 mg Oral BID   insulin aspart  0-15 Units Subcutaneous TID WC   insulin aspart  0-5 Units Subcutaneous QHS   ipratropium-albuterol  3 mL Nebulization Q6H   mouth rinse  15 mL Mouth Rinse q12n4p   methylPREDNISolone (SOLU-MEDROL) injection  40 mg Intravenous Daily   metoprolol tartrate  25 mg Oral TID   mometasone-formoterol  2 puff Inhalation BID   multivitamin with minerals  1 tablet Oral Daily   mupirocin ointment  1 application Nasal BID   pantoprazole  40 mg Oral Daily   phenylephrine  1 suppository Rectal BID   Continuous Infusions:  sodium chloride Stopped (09/20/20 2035)   amiodarone 30 mg/hr (09/21/20 0708)   PRN Meds: sodium chloride, acetaminophen **OR** acetaminophen, ipratropium-albuterol, metoprolol tartrate, nitroGLYCERIN, witch hazel-glycerin   Vital Signs    Vitals:   09/21/20 0320 09/21/20 0336 09/21/20 0400 09/21/20 0500  BP:   98/66 108/68  Pulse: 100  (!) 111 89  Resp: 17  17 17   Temp:  (!) 97 F (36.1 C)    TempSrc:  Axillary    SpO2: 96%  91% 96%  Weight:     103.5 kg  Height:        Intake/Output Summary (Last 24 hours) at 09/21/2020 0726 Last data filed at 09/21/2020 0708 Gross per 24 hour  Intake 831.39 ml  Output 1650 ml  Net -818.61 ml   Last 3 Weights 09/21/2020 09/20/2020 09/19/2020  Weight (lbs) 228 lb 2.8 oz 230 lb 2.6 oz 229 lb 11.5 oz  Weight (kg) 103.5 kg 104.4 kg 104.2 kg      Telemetry    Atrial fibrillation with rates in the 100s to 150s. Currently in the 150s. Short runs of NSVT also noted (longest run 5 beats). - Personally Reviewed  ECG    No new ECG tracing since admission. - Personally Reviewed  Physical Exam   GEN: No acute distress.   Neck: No JVD. Cardiac: Tachycardic with irregularly irregular rhythm. No murmurs, rubs, or gallops.  Respiratory: Increased work of breathing. Decreased breath sounds throughout with crackles and faint expiratory wheezing noted in bilateral bases. GI: Soft, non-distended, and non-tender. MS: 2+ pitting edema of bilateral lower extremities. No deformity. Skin: Warm and dry. Neuro:  No focal deficits. Psych: Normal affect. Responds appropriately.  Labs    High Sensitivity Troponin:   Recent Labs  Lab 09/17/20 1314 09/17/20 1522  TROPONINIHS 35* 31*  Chemistry Recent Labs  Lab 09/17/20 1314 09/18/20 0312 09/19/20 0305 09/20/20 0300 09/21/20 0301  NA 134* 139 136 136 137  K 4.5 4.6 4.3 4.5 4.4  CL 97* 97* 95* 95* 96*  CO2 28 27 29 29 31   GLUCOSE 132* 151* 163* 159* 147*  BUN 25* 28* 39* 47* 50*  CREATININE 1.21 1.28* 1.57* 1.37* 1.28*  CALCIUM 8.2* 8.5* 8.5* 8.7* 8.8*  PROT 7.3 7.1  --   --   --   ALBUMIN 3.8 3.6  --   --   --   AST 37 37  --   --   --   ALT 23 25  --   --   --   ALKPHOS 67 57  --   --   --   BILITOT 1.5* 1.1  --   --   --   GFRNONAA >60 58* 45* 53* 58*  ANIONGAP 9 15 12 12 10      Hematology Recent Labs  Lab 09/19/20 0305 09/20/20 0300 09/21/20 0301  WBC 15.1* 15.6* 15.5*  RBC 4.42 3.92* 4.07*  HGB 12.4* 10.8* 11.3*  HCT 39.1  34.9* 36.4*  MCV 88.5 89.0 89.4  MCH 28.1 27.6 27.8  MCHC 31.7 30.9 31.0  RDW 14.0 14.1 14.1  PLT 263 317 302    BNP Recent Labs  Lab 09/17/20 1314  BNP 445.0*     DDimer No results for input(s): DDIMER in the last 168 hours.   Radiology    09/23/20 RENAL  Result Date: 09/19/2020 CLINICAL DATA:  Acute kidney injury. EXAM: RENAL / URINARY TRACT ULTRASOUND COMPLETE COMPARISON:  None. FINDINGS: Right Kidney: Renal measurements: 10.2 x 5.0 x 5.4 cm = volume: 143 mL. Normal parenchymal echogenicity. No hydronephrosis. No visualized stone or focal lesion. Left Kidney: Renal measurements: 10.2 x 5.0 x 5.3 cm = volume: 140 mL. Normal parenchymal echogenicity. No hydronephrosis. No visualized stone or focal lesion. Bladder: Partially distended. Appears normal for degree of bladder distention. Other: Technically challenging and limited exam due to habitus and patient difficulty with breath hold. Incidental note of right upper quadrant ascites. The liver appears echogenic with questionable nodular contours. IMPRESSION: 1. No obstructive uropathy. Unremarkable sonographic appearance of the kidneys. 2. Incidental findings of right upper quadrant ascites. Increased liver echogenicity and questionable capsular nodularity, query cirrhosis. Recommend correlation with clinical history and cirrhosis risk factors. This is incompletely characterized on this renal exam. Electronically Signed   By: [place] M.D.   On: 09/19/2020 16:39   DG CHEST PORT 1 VIEW  Result Date: 09/19/2020 CLINICAL DATA:  Pneumonia.  Increased shortness of breath. EXAM: PORTABLE CHEST 1 VIEW COMPARISON:  Radiograph yesterday. FINDINGS: Unchanged cardiomegaly. Stable mediastinal contours. No significant change in streaky opacity at the right lung base and blunting of the costophrenic angle. No new airspace disease. There is mild peribronchial thickening. No pneumothorax. Stable osseous structures. IMPRESSION: 1. No significant change in  streaky opacity at the right lung base and blunting of the costophrenic angle. Findings may represent pneumonia or underlying scarring. 2. Stable cardiomegaly. 3. Peribronchial thickening may be bronchitic or congestive. Electronically Signed   By: 09/21/2020 M.D.   On: 09/19/2020 16:37    Cardiac Studies   Echocardiogram 09/18/2020: Impressions:  1. Left ventricular ejection fraction by 3D volume is 49 %. The left  ventricle has mildly decreased function. The left ventricle has no  regional wall motion abnormalities. There is mild left ventricular  hypertrophy. Left ventricular diastolic parameters  are indeterminate.   2. Right ventricular systolic function is normal. The right ventricular  size is normal. There is mildly elevated pulmonary artery systolic  pressure. The estimated right ventricular systolic pressure is 40.5 mmHg.   3. Right atrial size was moderately dilated.   4. The mitral valve is normal in structure. Moderate mitral valve  regurgitation. No evidence of mitral stenosis.   5. The aortic valve is normal in structure. Aortic valve regurgitation is  not visualized. Mild to moderate aortic valve sclerosis/calcification is  present, without any evidence of aortic stenosis.   6. The inferior vena cava is normal in size with greater than 50%  respiratory variability, suggesting right atrial pressure of 3 mmHg.  Patient Profile     75 y.o. male with a history of CAD, ischemic cardiomyopathy/chronic systolic CHF, paroxysmal atrial fibrillation not on anticoagulation at home, iatrogenic hemothorax from bedside thoracentesis in 03/2016 s/p VATS decortication with mechanical pleurodesis at [facility], COPD on home O2, hypertension, hyperlipidemia, type 2 diabetes, GERD, and prior tobacco use (quit 4 years ago) who is being seen 09/18/2020 for the evaluation of paroxysmal atrial fibrillation at the request of Dr. Rhona Leavens.  Assessment & Plan    Paroxysmal Atrial Fibrillation  -  Patient presented with worsening dyspnea on exertion for the last couple of months as well as weight gain and edema. Found to be in atrial fibrillation with RVR. Rates initially in the 170s. - Rates back up into the 150s this morning. - Electrolytes and TSH normal. - Echo showed LVEF of 49% with normal wall motion. - Currently on Lopressor 25mg  three times daily. Will increase to 50mg  three times daily. RN also going to give dose of IV Lopressor now per PRN orders. - Started on IV Amiodarone yesterday. May need to rebolus this but will see if increase in Lopressor helps any first. - CHA2D2-VASc = 6 (CAD, CHF, HTN, DM, age x2). Started on Eliquis 5mg  twice daily. - Given rates have been difficult to control and this is likely contributing to CHF, suspect we will need to perform TEE/DCCV during this admission once respiratory status improves some.   Acute on Chronic Combined CHF - BNP elevated at 445.  - Chest x-ray showed right lung opacities that may reflect pneumonia but no overt edema.  - Echo showed LVEF of 49% with normal wall motion. RV normal. - Currently on  IV Lasix 80mg  twice daily. Diuresis has been slow. Documented urinary output of 1.65 L yesterday and net negative 1.9 L since admission. Weight 228 lbs, down 2 lbs since admission. BUN has been trending up but creatinine stable. - Still appears volume overloaded. - Continue current dose of IV Lasix. May need to increase to three times daily will discuss with MD. - Continue Lopressor as above. Transition to Toprol-XL prior to discharge. - Will wait to start ARB/ARNI for now to allow for more rate control. - Continue to monitor daily weights, strict I/O's, and renal function.   Demand Ischemia History of CAD - History of CAD s/p PCIx2. - High-sensitivity troponin minimally elevated and flat at 35 >>32. Consistent with demand ischemia from atrial fibrillation with RVR and CHF. - No chest pain.  - Echo as above. - On DAPT with  Aspirin and Plavix at home. Aspirin stopped now that patient is on Eliquis. - Continue beta-blocker as above.  - Continue high-intensity statin. - No ischemic work-up planned at this time.    Hypertension - BP labile with systolic BP ranging from  90 to 183 yesterday. Well controlled this morning. - Continue Lopressor as above.    Hyperlipidemia - Continue Lipitor 40mg  daily.   Type 2 Diabetes Mellitus  - Management per primary team.  Hypomagnesemia - Supplemented and improved.  Acute CKD Stage III - Creatinine 1.21 on admission. Initially increased to 1.57 after diuresis but now trending back down. Creatinine 1.28 today. BUN has been rising the last several days and is 50 today. - Continue to monitor closely with diuresis.   COPD Exacerbation - Management per primary team.  Obstructive Sleep Apnea  - Continue BiPAP.  Leukocytosis - WBC 15.5 today. Afebrile. - Possible secondary to steroids.  - Management per primary team.   For questions or updates, please contact [facility] Please consult [email or web address] for contact info under        Signed, , PA-C  09/21/2020, 7:26 AM    Personally seen and examined. Agree with above.  75 year old with acute on chronic systolic heart failure paroxysmal atrial fibrillation demand ischemia/CAD.  Overall improving.  BiPAP.  Atrial fibrillation initially in the 170s, transition to the 150s this morning. - Increased atrial fibrillation medication metoprolol to 50 mg 3 times a day up from 25 mg 3 times a day.  IV amiodarone was started yesterday.  Agree that rebolus of IV amiodarone may be necessary but lets see what happens with the metoprolol first.  Chronic anticoagulation - Continue with Eliquis 5 mg twice a day. - We may need to proceed with TEE cardioversion once respiratory status improves if we are unable to successfully control atrial fibrillation.  Currently on IV Lasix negative about 2 L.  Diuresis seems to  be slow.  He is on IV 80 mg twice daily.  We will transition now to 3 times a day IV 80.  62, MD

## 2020-09-21 NOTE — TOC Progression Note (Signed)
Transition of Care [facility]) - Progression Note    Patient Details  Name: Dalton Juarez MRN: [id number] Date of Birth: 04-30-1945  Transition of [facility]) CM/SW Contact  Golda Acre, RN Phone Number: 09/21/2020, 9:04 AM  Clinical Narrative:    Request for heart failure screening at dc sent to [facility] at 0905.        Expected Discharge Plan and Services                                                 Social Determinants of Health (SDOH) Interventions    Readmission Risk Interventions No flowsheet data found.

## 2020-09-21 NOTE — Progress Notes (Signed)
PROGRESS NOTE    Dalton Juarez  XBM:[id number] DOB: 08-17-1945 DOA: 09/17/2020 PCP: Smith Robert, MD   Chief Complaint  Patient presents with   Tachycardia   Brief Narrative: 75 year old male with COPD on chronic 2 L of cannula, T2DM, CHF/ischemic cardiomyopathy, atrial fibrillation presented to the ED with 2 to 3 weeks of progressively worsening shortness of breath, swelling, 10 pound weight gain, progressively worsening DOE and has been using CPAP during the daytime template.  Reportedly slept with CPAP disconnected on the night prior to admission and woke up short of breath despite increased oxygen use and presented to the ED In the ED tachycardic and alternating with RVR, placed on Cardizem drip, chest x-ray with bilateral patchy opacity placed on Levaquin, and BiPAP and transferred to [facility] long stepdown unit Patient is being followed by cardiology, being managed with diuresis.  Subjective: Coughing yellow sputum No fever ,wbc up but on steroid. Still short of breath and tachy this am Came back to visit patient's wife at the bedside.  Assessment & Plan:  Acute on chronic combined congestive heart failure Presented with shortness of breath BNP elevated at 445 and chest x-ray right lung opacities Echo EF 49%.  Followed by cardiology and being managed with aggressive IV diuresis, blood pressure.  Appreciate cardiology input. Continue to monitor electrolytes.  Continue monitor intake, output, daily weight as below Filed Weights   09/19/20 0500 09/20/20 0500 09/21/20 0500  Weight: 104.2 kg 104.4 kg 103.5 kg  Net IO Since Admission: -2,489.21 mL [09/21/20 1358]   Intake/Output Summary (Last 24 hours) at 09/21/2020 1358 Last data filed at 09/21/2020 1044 Gross per 24 hour  Intake 372.32 ml  Output 2200 ml  Net -1827.68 ml    Atrial fibrillation with RVR: Rates initially in the 170s intermittently rate going up to 140s 50s this morning TSH normal Echo 49% EF.  Lopressor increased to  50 mg 3 times daily, cardiology following.  Started on amiodarone infusion 8/14, continue Eliquis for anticoagulation.  May need TEE/DCCV during this admission.  Demand ischemia with mildly positive troponin 35>32 CAD Elevated troponin in the setting of CHF and A. fib RVR.  No chest pain.  Echo reviewed.  Normally on DAPT with aspirin/Plavix and aspirin discontinued since starting Eliquis, continue metoprolol, Lipitor. 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  At times labile so monitor  OSA on CPAP: Needing BiPAP nightly.  Acute on chronic hypoxic respiratory failure with CHF and COPD.  Continue supplemental oxygen seizure management and symptom management.   COPD with acute exacerbation: I will continue on IV steroid for now given his tenuous respiratory status.  Continue DuoNeb, Dulera.  At home on Symbicort.  Continue bronchodilator.  AKI in the setting of diuresis creatinine slowly downtrending tolerating diuresis.  Monitor. 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 Recent Labs  Lab 09/17/20 1342 09/17/20 1522 09/18/20 0312 09/18/20 0729 09/18/20 1254 09/19/20 0305 09/20/20 0300 09/21/20 0301  WBC  --   --  9.6 7.8  --  15.1* 15.6* 15.5*  LATICACIDVEN 1.6 1.3  --   --   --   --   --   --   PROCALCITON  --   --    --   --  <0.10  --   --  <0.10    Diet Order             Diet heart healthy/carb modified Room service appropriate? Yes; Fluid consistency: Thin  Diet effective now                  Patient's Body mass index is 35.74 kg/m. DVT prophylaxis: eliquis Code Status:   Code Status: Full Code  Family Communication: plan of care discussed with patient and his wife at bedside. Status is: Inpatient  Remains inpatient appropriate because:IV treatments appropriate due to intensity of illness or inability to take PO and Inpatient level of care appropriate due to severity of illness  Dispo: The patient is from: Home              Anticipated d/c is to: Home              Patient currently is not medically stable to d/c.   Difficult to place patient No Unresulted Labs (From admission, onward)     Start     Ordered   09/22/20 0500  Procalcitonin  Daily,   R     Question:  Specimen collection method  Answer:  Lab=Lab collect   09/21/20 1223   09/21/20 1224  Procalcitonin - Baseline  Add-on,   AD       Question:  Specimen collection method  Answer:  Lab=Lab collect   09/21/20 1223   09/18/20 0500  Basic metabolic panel  Daily,   R      09/17/20 1548            Medications reviewed:  Scheduled Meds:  apixaban  5 mg Oral BID   atorvastatin  40 mg Oral Daily   chlorhexidine  15 mL Mouth Rinse BID   Chlorhexidine Gluconate Cloth  6 each Topical Daily   cholecalciferol  2,000 Units Oral Daily   clopidogrel  75 mg Oral Daily   dorzolamide  1 drop Left Eye BID   furosemide  80 mg Intravenous TID   gabapentin  300 mg Oral TID   guaiFENesin  600 mg Oral BID   insulin aspart  0-15 Units Subcutaneous TID WC   insulin aspart  0-5 Units Subcutaneous QHS   ipratropium-albuterol  3 mL Nebulization Q6H   mouth rinse  15 mL Mouth Rinse q12n4p   methylPREDNISolone (SOLU-MEDROL) injection  40 mg Intravenous Daily   metoprolol tartrate  50 mg Oral TID   mometasone-formoterol  2 puff Inhalation  BID   multivitamin with minerals  1 tablet Oral Daily   mupirocin ointment  1 application Nasal BID   pantoprazole  40 mg Oral Daily   phenylephrine  1 suppository Rectal BID   Continuous Infusions:  sodium chloride Stopped (09/20/20 2035)   amiodarone 30 mg/hr (09/21/20 1052)   Consultants:see note  Procedures:see note Antimicrobials: Anti-infectives (From admission, onward)    Start     Dose/Rate Route Frequency Ordered Stop   09/17/20 1415  levofloxacin (LEVAQUIN) IVPB 500 mg        500 mg  100 mL/hr over 60 Minutes Intravenous  Once 09/17/20 1400 09/17/20 1818      Culture/Microbiology No results found for: SDES, SPECREQUEST, CULT, REPTSTATUS  Other culture-see note  Objective: Vitals: Today's Vitals   09/21/20 0900 09/21/20 1000 09/21/20 1204 09/21/20 1208  BP: (!) 132/119 (!) 143/92  (!) 168/89  Pulse: (!) 122 (!) 56  (!) 120  Resp: (!) 37 18  11  Temp:      TempSrc:      SpO2: 91% 100% 93% 100%  Weight:      Height:      PainSc:  Asleep      Intake/Output Summary (Last 24 hours) at 09/21/2020 1223 Last data filed at 09/21/2020 1044 Gross per 24 hour  Intake 406.96 ml  Output 2200 ml  Net -1793.04 ml   Filed Weights   09/19/20 0500 09/20/20 0500 09/21/20 0500  Weight: 104.2 kg 104.4 kg 103.5 kg   Weight change: -0.9 kg  Intake/Output from previous day: 08/14 0701 - 08/15 0700 In: 796.3 [P.O.:180; I.V.:616.3] Out: 1650 [Urine:1650] Intake/Output this shift: Total I/O In: 35.1 [I.V.:35.1] Out: 550 [Urine:550] Filed Weights   09/19/20 0500 09/20/20 0500 09/21/20 0500  Weight: 104.2 kg 104.4 kg 103.5 kg   Examination: General exam: AAO x3, obese, on nasal cannula oxygen not in distress coughing at times HEENT:Oral mucosa moist, Ear/Nose WNL grossly,dentition normal. Respiratory system: bilaterally decreased breath sounds throughout with crackles and faint wheezing at the base,has work of breathing. Cardiovascular system: S1 & S2 +,No  JVD. Gastrointestinal system: Abdomen soft, NT,ND, BS+. Nervous System:Alert, awake, moving extremities. Extremities: 2+ pitting edema, distal peripheral pulses palpable.  Skin: No rashes,no icterus. MSK: Normal muscle bulk,tone, power.  Data Reviewed: I have personally reviewed following labs and imaging studies CBC: Recent Labs  Lab 09/17/20 1314 09/18/20 0312 09/18/20 0729 09/19/20 0305 09/20/20 0300 09/21/20 0301  WBC 14.8* 9.6 7.8 15.1* 15.6* 15.5*  NEUTROABS 9.9*  --   --   --   --   --   HGB 12.5* 11.3* 11.1* 12.4* 10.8* 11.3*  HCT 39.2 36.5* 35.5* 39.1 34.9* 36.4*  MCV 89.9 89.9 88.3 88.5 89.0 89.4  PLT 350 319 309 263 317 302   Basic Metabolic Panel: Recent Labs  Lab 09/17/20 1314 09/18/20 0312 09/18/20 1254 09/19/20 0305 09/20/20 0300 09/21/20 0301  NA 134* 139  --  136 136 137  K 4.5 4.6  --  4.3 4.5 4.4  CL 97* 97*  --  95* 95* 96*  CO2 28 27  --  29 29 31   GLUCOSE 132* 151*  --  163* 159* 147*  BUN 25* 28*  --  39* 47* 50*  CREATININE 1.21 1.28*  --  1.57* 1.37* 1.28*  CALCIUM 8.2* 8.5*  --  8.5* 8.7* 8.8*  MG 1.2*  --  1.6*  --   --  2.2   GFR: Estimated Creatinine Clearance: 55.4 mL/min (A) (by C-G formula based on SCr of 1.28 mg/dL (H)). Liver Function Tests: Recent Labs  Lab 09/17/20 1314 09/18/20 0312  AST 37 37  ALT 23 25  ALKPHOS 67 57  BILITOT 1.5* 1.1  PROT 7.3 7.1  ALBUMIN 3.8 3.6   No results for input(s): LIPASE, AMYLASE in the last 168 hours. No results for input(s): AMMONIA in the last 168 hours. Coagulation Profile: Recent Labs  Lab 09/17/20 1314  INR 1.1   Cardiac Enzymes: No results for input(s): CKTOTAL, CKMB, CKMBINDEX, TROPONINI in the last 168 hours. BNP (last  3 results) No results for input(s): PROBNP in the last 8760 hours. HbA1C: No results for input(s): HGBA1C in the last 72 hours. CBG: Recent Labs  Lab 09/20/20 1247 09/20/20 1635 09/20/20 2137 09/21/20 0759 09/21/20 1153  GLUCAP 162* 210* 139* 139*  146*   Lipid Profile: No results for input(s): CHOL, HDL, LDLCALC, TRIG, CHOLHDL, LDLDIRECT in the last 72 hours. Thyroid Function Tests: Recent Labs    09/18/20 1254  TSH 0.797   Anemia Panel: No results for input(s): VITAMINB12, FOLATE, FERRITIN, TIBC, IRON, RETICCTPCT in the last 72 hours. Sepsis Labs: Recent Labs  Lab 09/17/20 1342 09/17/20 1522 09/18/20 1254  PROCALCITON  --   --  <0.10  LATICACIDVEN 1.6 1.3  --     Recent Results (from the past 240 hour(s))  Resp Panel by RT-PCR (Flu A&B, Covid) Nasopharyngeal Swab     Status: None   Collection Time: 09/17/20  6:00 PM   Specimen: Nasopharyngeal Swab; Nasopharyngeal(NP) swabs in vial transport medium  Result Value Ref Range Status   SARS Coronavirus 2 by RT PCR NEGATIVE NEGATIVE Final    Comment: (NOTE) SARS-CoV-2 target nucleic acids are NOT DETECTED.  The SARS-CoV-2 RNA is generally detectable in upper respiratory specimens during the acute phase of infection. The lowest concentration of SARS-CoV-2 viral copies this assay can detect is 138 copies/mL. A negative result does not preclude SARS-Cov-2 infection and should not be used as the sole basis for treatment or other patient management decisions. A negative result may occur with  improper specimen collection/handling, submission of specimen other than nasopharyngeal swab, presence of viral mutation(s) within the areas targeted by this assay, and inadequate number of viral copies(<138 copies/mL). A negative result must be combined with clinical observations, patient history, and epidemiological information. The expected result is Negative.  Fact Sheet for Patients:  BloggerCourse.comhttps://www.fda.gov/media/152166/download  Fact Sheet for Healthcare Providers:  SeriousBroker.ithttps://www.fda.gov/media/152162/download  This test is no t yet approved or cleared by the [place] FDA and  has been authorized for detection and/or diagnosis of SARS-CoV-2 by FDA under an Emergency Use  Authorization (EUA). This EUA will remain  in effect (meaning this test can be used) for the duration of the COVID-19 declaration under Section 564(b)(1) of the Act, 21 U.S.C.section 360bbb-3(b)(1), unless the authorization is terminated  or revoked sooner.       Influenza A by PCR NEGATIVE NEGATIVE Final   Influenza B by PCR NEGATIVE NEGATIVE Final    Comment: (NOTE) The Xpert Xpress SARS-CoV-2/FLU/RSV plus assay is intended as an aid in the diagnosis of influenza from Nasopharyngeal swab specimens and should not be used as a sole basis for treatment. Nasal washings and aspirates are unacceptable for Xpert Xpress SARS-CoV-2/FLU/RSV testing.  Fact Sheet for Patients: BloggerCourse.comhttps://www.fda.gov/media/152166/download  Fact Sheet for Healthcare Providers: SeriousBroker.ithttps://www.fda.gov/media/152162/download  This test is not yet approved or cleared by the [place] FDA and has been authorized for detection and/or diagnosis of SARS-CoV-2 by FDA under an Emergency Use Authorization (EUA). This EUA will remain in effect (meaning this test can be used) for the duration of the COVID-19 declaration under Section 564(b)(1) of the Act, 21 U.S.C. section 360bbb-3(b)(1), unless the authorization is terminated or revoked.  Performed at [facility], [address] [place], [place] [address]   MRSA Next Gen by PCR, Nasal     Status: Abnormal   Collection Time: 09/18/20 12:17 AM   Specimen: Nasal Mucosa; Nasal Swab  Result Value Ref Range Status   MRSA by PCR Next Gen DETECTED (  A) NOT DETECTED Final    Comment: RESULT CALLED TO, READ BACK BY AND VERIFIED WITH: DANIELLE, RN @ [phone] ON 09/18/20 C VARNER (NOTE) The GeneXpert MRSA Assay (FDA approved for NASAL specimens only), is one component of a comprehensive MRSA colonization surveillance program. It is not intended to diagnose MRSA infection nor to guide or monitor treatment for MRSA infections. Test performance is not FDA approved in patients less  than 45 years old. Performed at [facility], [address] [place], [place] [address]      Radiology Studies: US RENAL  Result Date: 09/19/2020 CLINICAL DATA:  Acute kidney injury. EXAM: RENAL / URINARY TRACT ULTRASOUND COMPLETE COMPARISON:  None. FINDINGS: Right Kidney: Renal measurements: 10.2 x 5.0 x 5.4 cm = volume: 143 mL. Normal parenchymal echogenicity. No hydronephrosis. No visualized stone or focal lesion. Left Kidney: Renal measurements: 10.2 x 5.0 x 5.3 cm = volume: 140 mL. Normal parenchymal echogenicity. No hydronephrosis. No visualized stone or focal lesion. Bladder: Partially distended. Appears normal for degree of bladder distention. Other: Technically challenging and limited exam due to habitus and patient difficulty with breath hold. Incidental note of right upper quadrant ascites. The liver appears echogenic with questionable nodular contours. IMPRESSION: 1. No obstructive uropathy. Unremarkable sonographic appearance of the kidneys. 2. Incidental findings of right upper quadrant ascites. Increased liver echogenicity and questionable capsular nodularity, query cirrhosis. Recommend correlation with clinical history and cirrhosis risk factors. This is incompletely characterized on this renal exam. Electronically Signed   By: Narda Rutherford M.D.   On: 09/19/2020 16:39   DG CHEST PORT 1 VIEW  Result Date: 09/19/2020 CLINICAL DATA:  Pneumonia.  Increased shortness of breath. EXAM: PORTABLE CHEST 1 VIEW COMPARISON:  Radiograph yesterday. FINDINGS: Unchanged cardiomegaly. Stable mediastinal contours. No significant change in streaky opacity at the right lung base and blunting of the costophrenic angle. No new airspace disease. There is mild peribronchial thickening. No pneumothorax. Stable osseous structures. IMPRESSION: 1. No significant change in streaky opacity at the right lung base and blunting of the costophrenic angle. Findings may represent pneumonia or  underlying scarring. 2. Stable cardiomegaly. 3. Peribronchial thickening may be bronchitic or congestive. Electronically Signed   By: Narda Rutherford M.D.   On: 09/19/2020 16:37     LOS: 4 days   Lanae Boast, MD [facility]  09/21/2020, 12:23 PM

## 2020-09-22 ENCOUNTER — Inpatient Hospital Stay (HOSPITAL_COMMUNITY): Payer: Medicare Other

## 2020-09-22 DIAGNOSIS — I4891 Unspecified atrial fibrillation: Secondary | ICD-10-CM | POA: Diagnosis not present

## 2020-09-22 DIAGNOSIS — I5043 Acute on chronic combined systolic (congestive) and diastolic (congestive) heart failure: Secondary | ICD-10-CM | POA: Diagnosis not present

## 2020-09-22 LAB — GLUCOSE, CAPILLARY
Glucose-Capillary: 109 mg/dL — ABNORMAL HIGH (ref 70–99)
Glucose-Capillary: 136 mg/dL — ABNORMAL HIGH (ref 70–99)
Glucose-Capillary: 208 mg/dL — ABNORMAL HIGH (ref 70–99)
Glucose-Capillary: 186 mg/dL — ABNORMAL HIGH (ref 70–99)
Glucose-Capillary: 159 mg/dL — ABNORMAL HIGH (ref 70–99)

## 2020-09-22 LAB — CBC
HCT: 40.1 % (ref 39.0–52.0)
Hemoglobin: 12.3 g/dL — ABNORMAL LOW (ref 13.0–17.0)
MCH: 27.6 pg (ref 26.0–34.0)
MCHC: 30.7 g/dL (ref 30.0–36.0)
MCV: 90.1 fL (ref 80.0–100.0)
Platelets: 318 10*3/uL (ref 150–400)
RBC: 4.45 MIL/uL (ref 4.22–5.81)
RDW: 13.9 % (ref 11.5–15.5)
WBC: 16.2 10*3/uL — ABNORMAL HIGH (ref 4.0–10.5)
nRBC: 0 % (ref 0.0–0.2)

## 2020-09-22 LAB — BASIC METABOLIC PANEL
Anion gap: 10 (ref 5–15)
BUN: 54 mg/dL — ABNORMAL HIGH (ref 8–23)
CO2: 31 mmol/L (ref 22–32)
Calcium: 8.8 mg/dL — ABNORMAL LOW (ref 8.9–10.3)
Chloride: 95 mmol/L — ABNORMAL LOW (ref 98–111)
Creatinine, Ser: 1.5 mg/dL — ABNORMAL HIGH (ref 0.61–1.24)
GFR, Estimated: 48 mL/min — ABNORMAL LOW (ref >60)
Glucose, Bld: 152 mg/dL — ABNORMAL HIGH (ref 70–99)
Potassium: 4.8 mmol/L (ref 3.5–5.1)
Sodium: 136 mmol/L (ref 135–145)

## 2020-09-22 LAB — PROCALCITONIN: Procalcitonin: <0.1 ng/mL

## 2020-09-22 MED ORDER — GUAIFENESIN-DM 100-10 MG/5ML PO SYRP
5.0000 mL | ORAL_SOLUTION | ORAL | Status: DC | PRN
  Administered 2020-09-22 – 2020-09-29 (×3): 5 mL via ORAL
  Filled 2020-09-22 (×3): qty 10

## 2020-09-22 MED ORDER — METOPROLOL TARTRATE 25 MG PO TABS: 50.0000 mg | ORAL_TABLET | Freq: Four times a day (QID) | ORAL | Status: DC

## 2020-09-22 MED ORDER — METOPROLOL TARTRATE 25 MG PO TABS
50.0000 mg | ORAL_TABLET | Freq: Four times a day (QID) | ORAL | Status: DC
  Administered 2020-09-22 – 2020-09-26 (×19): 50 mg via ORAL
  Filled 2020-09-22 (×20): qty 2

## 2020-09-22 NOTE — Progress Notes (Signed)
Pt. Complains of feeling "drunk in the head, swimmy headed".  Pt. Blood pressure checked, blood sugar checked, basic stroke assessment completed without any findings.  MD made aware.

## 2020-09-22 NOTE — Progress Notes (Signed)
PROGRESS NOTE    Dalton Juarez  ONG:[id number] DOB: 04/27/1945 DOA: 09/17/2020 PCP: Smith RobertKikel, Stephen, MD   Chief Complaint  Patient presents with   Tachycardia   Brief Narrative: 75 year old male with COPD on chronic 2 L of cannula, T2DM, CHF/ischemic cardiomyopathy, atrial fibrillation presented to the ED with 2 to 3 weeks of progressively worsening shortness of breath, swelling, 10 pound weight gain, progressively worsening DOE and has been using CPAP during the daytime template.  Reportedly slept with CPAP disconnected on the night prior to admission and woke up short of breath despite increased oxygen use and presented to the ED In the ED tachycardic and alternating with RVR, placed on Cardizem drip, chest x-ray with bilateral patchy opacity placed on Levaquin, and BiPAP and [facility] stepdown [facility] Patient is being followed by cardiology, being managed with diuresis.  Subjective: Seen and examined this morning.  Appears comfortable on BiPAP.  Patient reports he normally sleeps in the "ventilator" at home.  He feels his breathing is little better today.  Still have coughing spells Heart rate poorly controlled and was re bolused Amio 150 mg 8/15 pm. Overnight afebrile Creat up 1.2> 1.5, wbc stil at 16.2  Assessment & Plan:  Acute on chronic combined congestive heart failure Presented with shortness of breath BNP elevated at 445 and chest x-ray right lung opacities Echo EF 49%.  Followed by cardiology and being managed with aggressive IV diuresis. On lasix 80 mg tid, metoprolol 50 mg tid.  Cardiology following closely appreciate input. Continue to monitor electrolytes.  Continue monitor intake, output, daily weight as below overall weight up by 0.5 kg.  Still appears volume overloaded.  CXR 8/16" Persistent streaky opacities at the right lung base with blunting of the right costophrenic angle" and this is unchanged. Filed Weights   09/20/20 0500 09/21/20 0500 09/22/20 0500   Weight: 104.4 kg 103.5 kg 104 kg  Net IO Since Admission: -3,279.15 mL [09/22/20 1115] Intake/Output Summary (Last 24 hours) at 09/22/2020 1115 Last data filed at 09/22/2020 0600 Gross per 24 hour  Intake 460.06 ml  Output 1250 ml  Net -789.94 ml   Atrial fibrillation with RVR: Rates initially in the 170s.  Remains poorly controlled,re bolused Amio 150 mg 8/15 pm.  On Lopressor 50 mg 3 times daily  and appreciate cardiology management on board .TSH normal Echo 49% EF.  Continue Eliquis for anticoagulation.  May need TEE/DCCV during this admission.  Demand ischemia with mildly positive troponin 35>32 CAD Elevated troponin in the setting of CHF and A. fib RVR.  No chest pain.  Echo reviewed.  Normally on DAPT with aspirin/Plavix and aspirin discontinued since starting Eliquis, continue metoprolol, Lipitor. Acute on chronic hypoxic respiratory failure due to COPD, CHF exacerbation: chronically on 2 L nasal cannula  Hypertension blood pressure stable this am- noted overnight one episode of 85/61.  He is needing the rate controlling medications as below.  Monitor.   OSA on CPAP: Needing BiPAP nightly.  Acute on chronic hypoxic respiratory failure with CHF and COPD.  Continue dialysis continue supplemental oxygen and BiPAP   COPD with acute exacerbation: Continue on IV steroid for now given his tenuous respiratory status.  Continue DuoNeb, Dulera.  At home on Symbicort.  Continue bronchodilator.  Chest x-ray reviewed this morning.  AKI in the setting of diuresis,?  Cardiorenal syndrome.  Creatinine slightly up but remains to be 1.2-1.5. Monitor. Recent Labs  Lab 09/18/20 [phone] 09/19/20 0305 09/20/20 0300 09/21/20 0301 09/22/20 0243  BUN 28* 39* 47* 50* 54*  CREATININE 1.28* 1.57* 1.37* 1.28* 1.50*    T2DM last HbA1c 6.38/11.  Sugar well controlled. 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 Recent Labs  Lab 09/17/20 1342 09/17/20 1522 09/18/20 0312 09/18/20 0729 09/18/20 1254 09/19/20 0305 09/20/20 0300 09/21/20 0301 09/22/20 0243  WBC  --   --    < > 7.8  --  15.1* 15.6* 15.5* 16.2*  LATICACIDVEN 1.6 1.3  --   --   --   --   --   --   --   PROCALCITON  --   --   --   --  <0.10  --   --  <0.10 <0.10   < > = values in this interval not displayed.    Diet Order             Diet heart healthy/carb modified Room service appropriate? Yes; Fluid consistency: Thin  Diet effective now                  Patient's Body mass index is 35.91 kg/m. DVT prophylaxis: Place and maintain sequential compression device Start: 09/21/20 1301eliquis Code Status:   Code Status: Full Code  Family Communication: plan of care discussed with patient and his wife at bedside. Status is: Inpatient  Remains inpatient appropriate because:IV treatments appropriate due to intensity of illness or inability to take PO and Inpatient level of care appropriate due to severity of illness  Dispo: The patient is from: Home              Anticipated d/c is to: Home              Patient currently is not medically stable to d/c.   Difficult to place patient No Unresulted Labs (From admission, onward)     Start     Ordered   09/22/20 0500  Procalcitonin  Daily,   R     Question:  Specimen collection method  Answer:  Lab=Lab collect   09/21/20 1223   09/22/20 0500  Basic metabolic panel  Daily,   R     Question:  Specimen collection method  Answer:  Lab=Lab collect   09/21/20 1224   09/22/20 0500  CBC  Daily,   R     Question:  Specimen collection method  Answer:  Lab=Lab collect   09/21/20 1224            Medications reviewed:   Scheduled Meds:  apixaban  5 mg Oral BID   atorvastatin  40 mg Oral Daily   chlorhexidine  15 mL Mouth Rinse BID   Chlorhexidine Gluconate Cloth  6 each Topical Daily   cholecalciferol  2,000 Units Oral Daily   clopidogrel  75 mg Oral Daily   dorzolamide  1 drop Left Eye BID   furosemide  80 mg Intravenous TID   gabapentin  300 mg Oral TID   guaiFENesin  600 mg Oral BID   insulin aspart  0-15 Units Subcutaneous TID WC   insulin aspart  0-5 Units Subcutaneous QHS   ipratropium-albuterol  3 mL Nebulization Q6H  mouth rinse  15 mL Mouth Rinse q12n4p   methylPREDNISolone (SOLU-MEDROL) injection  40 mg Intravenous Daily   metoprolol tartrate  50 mg Oral QID   mometasone-formoterol  2 puff Inhalation BID   multivitamin with minerals  1 tablet Oral Daily   mupirocin ointment  1 application Nasal BID   pantoprazole  40 mg Oral Daily   phenylephrine  1 suppository Rectal BID   Continuous Infusions:  sodium chloride Stopped (09/20/20 2035)   amiodarone 30 mg/hr (09/22/20 0600)   Consultants:see note  Procedures:see note Antimicrobials: Anti-infectives (From admission, onward)    Start     Dose/Rate Route Frequency Ordered Stop   09/17/20 1415  levofloxacin (LEVAQUIN) IVPB 500 mg        500 mg 100 mL/hr over 60 Minutes Intravenous  Once 09/17/20 1400 09/17/20 1818      Culture/Microbiology No results found for: SDES, SPECREQUEST, CULT, REPTSTATUS  Other culture-see note  Objective: Vitals: Today's Vitals   09/22/20 0815 09/22/20 0825 09/22/20 0900 09/22/20 1002  BP:      Pulse: 78 83 98   Resp: 20 18 18    Temp:      TempSrc:      SpO2: 100% 100% 98% 100%  Weight:      Height:      PainSc:        Intake/Output Summary (Last 24 hours) at 09/22/2020 1115 Last data filed at 09/22/2020 0600 Gross per 24 hour  Intake 460.06 ml  Output 1250 ml  Net -789.94 ml   Filed Weights   09/20/20 0500 09/21/20 0500 09/22/20 0500  Weight: 104.4 kg 103.5 kg 104 kg   Weight  change: 0.5 kg  Intake/Output from previous day: 08/15 0701 - 08/16 0700 In: 495.2 [I.V.:495.2] Out: 1800 [Urine:1800] Intake/Output this shift: No intake/output data recorded. Filed Weights   09/20/20 0500 09/21/20 0500 09/22/20 0500  Weight: 104.4 kg 103.5 kg 104 kg   Examination: General exam: AAOx3, obese, chronically sick looking, older than stated age, weak appearing. HEENT:Oral mucosa moist, Ear/Nose WNL grossly, dentition normal. Respiratory system: bilaterally clear breath sounds, has cough with deep breath, diminished breath sounds at the base, no use of accessory muscle Cardiovascular system: S1 & S2 +, No JVD,. Gastrointestinal system: Abdomen soft,NT,ND, BS+ Nervous System:Alert, awake, moving extremities and grossly nonfocal Extremities: le edema, distal peripheral pulses palpable.  Skin: No rashes,no icterus. MSK: Normal muscle bulk,tone, power   Data Reviewed: I have personally reviewed following labs and imaging studies CBC: Recent Labs  Lab 09/17/20 1314 09/18/20 0312 09/18/20 0729 09/19/20 0305 09/20/20 0300 09/21/20 0301 09/22/20 0243  WBC 14.8*   < > 7.8 15.1* 15.6* 15.5* 16.2*  NEUTROABS 9.9*  --   --   --   --   --   --   HGB 12.5*   < > 11.1* 12.4* 10.8* 11.3* 12.3*  HCT 39.2   < > 35.5* 39.1 34.9* 36.4* 40.1  MCV 89.9   < > 88.3 88.5 89.0 89.4 90.1  PLT 350   < > 309 263 317 302 318   < > = values in this interval not displayed.   Basic Metabolic Panel: Recent Labs  Lab 09/17/20 1314 09/18/20 0312 09/18/20 1254 09/19/20 0305 09/20/20 0300 09/21/20 0301 09/22/20 0243  NA 134* 139  --  136 136 137 136  K 4.5 4.6  --  4.3 4.5 4.4 4.8  CL 97* 97*  --  95* 95* 96* 95*  CO2 28 27  --  29 29 31 31   GLUCOSE 132* 151*  --  163* 159* 147* 152*  BUN 25* 28*  --  39* 47* 50* 54*  CREATININE 1.21 1.28*  --  1.57* 1.37* 1.28* 1.50*  CALCIUM 8.2* 8.5*  --  8.5* 8.7* 8.8* 8.8*  MG 1.2*  --  1.6*  --   --  2.2  --    GFR: Estimated Creatinine  Clearance: 47.4 mL/min (A) (by C-G formula based on SCr of 1.5 mg/dL (H)). Liver Function Tests: Recent Labs  Lab 09/17/20 1314 09/18/20 0312  AST 37 37  ALT 23 25  ALKPHOS 67 57  BILITOT 1.5* 1.1  PROT 7.3 7.1  ALBUMIN 3.8 3.6   No results for input(s): LIPASE, AMYLASE in the last 168 hours. No results for input(s): AMMONIA in the last 168 hours. Coagulation Profile: Recent Labs  Lab 09/17/20 1314  INR 1.1   Cardiac Enzymes: No results for input(s): CKTOTAL, CKMB, CKMBINDEX, TROPONINI in the last 168 hours. BNP (last 3 results) No results for input(s): PROBNP in the last 8760 hours. HbA1C: No results for input(s): HGBA1C in the last 72 hours. CBG: Recent Labs  Lab 09/21/20 0759 09/21/20 1153 09/21/20 1710 09/21/20 2117 09/22/20 0808  GLUCAP 139* 146* 178* 137* 109*   Lipid Profile: No results for input(s): CHOL, HDL, LDLCALC, TRIG, CHOLHDL, LDLDIRECT in the last 72 hours. Thyroid Function Tests: No results for input(s): TSH, T4TOTAL, FREET4, T3FREE, THYROIDAB in the last 72 hours.  Anemia Panel: No results for input(s): VITAMINB12, FOLATE, FERRITIN, TIBC, IRON, RETICCTPCT in the last 72 hours. Sepsis Labs: Recent Labs  Lab 09/17/20 1342 09/17/20 1522 09/18/20 1254 09/21/20 0301 09/22/20 0243  PROCALCITON  --   --  <0.10 <0.10 <0.10  LATICACIDVEN 1.6 1.3  --   --   --     Recent Results (from the past 240 hour(s))  Resp Panel by RT-PCR (Flu A&B, Covid) Nasopharyngeal Swab     Status: None   Collection Time: 09/17/20  6:00 PM   Specimen: Nasopharyngeal Swab; Nasopharyngeal(NP) swabs in vial transport medium  Result Value Ref Range Status   SARS Coronavirus 2 by RT PCR NEGATIVE NEGATIVE Final    Comment: (NOTE) SARS-CoV-2 target nucleic acids are NOT DETECTED.  The SARS-CoV-2 RNA is generally detectable in upper respiratory specimens during the acute phase of infection. The lowest concentration of SARS-CoV-2 viral copies this assay can detect is 138  copies/mL. A negative result does not preclude SARS-Cov-2 infection and should not be used as the sole basis for treatment or other patient management decisions. A negative result may occur with  improper specimen collection/handling, submission of specimen other than nasopharyngeal swab, presence of viral mutation(s) within the areas targeted by this assay, and inadequate number of viral copies(<138 copies/mL). A negative result must be combined with clinical observations, patient history, and epidemiological information. The expected result is Negative.  Fact Sheet for Patients:  11/17/20  Fact Sheet for Healthcare Providers:  BloggerCourse.com  This test is no t yet approved or cleared by the [employer] FDA and  has been authorized for detection and/or diagnosis of SARS-CoV-2 by FDA under an Emergency Use Authorization (EUA). This EUA will remain  in effect (meaning this test can be used) for the duration of the COVID-19 declaration under Section 564(b)(1) of the Act, 21 U.S.C.section 360bbb-3(b)(1), unless the authorization is terminated  or revoked sooner.       Influenza A by PCR NEGATIVE NEGATIVE Final   Influenza B  by PCR NEGATIVE NEGATIVE Final    Comment: (NOTE) The Xpert Xpress SARS-CoV-2/FLU/RSV plus assay is intended as an aid in the diagnosis of influenza from Nasopharyngeal swab specimens and should not be used as a sole basis for treatment. Nasal washings and aspirates are unacceptable for Xpert Xpress SARS-CoV-2/FLU/RSV testing.  Fact Sheet for Patients: BloggerCourse.com  Fact Sheet for Healthcare Providers: [employer]ious[employer]ker[employer]it  This test is not yet approved or cleared by the [place] FDA and has been authorized for detection and/or diagnosis of SARS-CoV-2 by FDA under an Emergency Use Authorization (EUA). This EUA will remain in effect (meaning  this test can be used) for the duration of the COVID-19 declaration under Section 564(b)(1) of the Act, 21 U.S.C. section 360bbb-3(b)(1), unless the authorization is terminated or revoked.  Performed at [facility], [address] [place], [place] [address]   MRSA Next Gen by PCR, Nasal     Status: Abnormal   Collection Time: 09/18/20 12:17 AM   Specimen: Nasal Mucosa; Nasal Swab  Result Value Ref Range Status   MRSA by PCR Next Gen DETECTED (A) NOT DETECTED Final    Comment: RESULT CALLED TO, READ BACK BY AND VERIFIED WITH: DANIELLE, RN @ [phone] ON 09/18/20 C VARNER (NOTE) The GeneXpert MRSA Assay (FDA approved for NASAL specimens only), is one component of a comprehensive MRSA colonization surveillance program. It is not intended to diagnose MRSA infection nor to guide or monitor treatment for MRSA infections. Test performance is not FDA approved in patients less than 76 years old. Performed at [facility], [address] [place], [place] [address]      Radiology Studies: DG Chest 1 View  Result Date: 09/22/2020 CLINICAL DATA:  Shortness of breath EXAM: CHEST  1 VIEW COMPARISON:  09/19/2020 FINDINGS: Stable cardiomegaly. Persistent streaky opacities at the right lung base with blunting of the right costophrenic angle. No significant interval change from prior. No new airspace opacity. No pneumothorax. IMPRESSION: Persistent streaky opacities at the right lung base with blunting of the right costophrenic angle, not significantly changed from prior. Electronically Signed   By: Duanne Guess D.O.   On: 09/22/2020 08:08     LOS: 5 days   Lanae Boast, MD [facility]  09/22/2020, 11:15 AM

## 2020-09-22 NOTE — Progress Notes (Addendum)
Progress Note  Patient Name: Dalton Juarez Date of Encounter: 09/22/2020  [facility] Cardiologist: Thurmon Fair, MD   Subjective   No acute overnight events. Patient feels like he is breathing a little bit better today. Still have a lot of coughing fits. Spoke with RN. He was on BiPAP a lot yesterday during the day just because he prefers being on (O2 sats were fine on nasal cannula). No chest pain. Rates still elevated but better controlled today in the 110s. No palpitations.   Inpatient Medications    Scheduled Meds:  apixaban  5 mg Oral BID   atorvastatin  40 mg Oral Daily   chlorhexidine  15 mL Mouth Rinse BID   Chlorhexidine Gluconate Cloth  6 each Topical Daily   cholecalciferol  2,000 Units Oral Daily   clopidogrel  75 mg Oral Daily   dorzolamide  1 drop Left Eye BID   furosemide  80 mg Intravenous TID   gabapentin  300 mg Oral TID   guaiFENesin  600 mg Oral BID   insulin aspart  0-15 Units Subcutaneous TID WC   insulin aspart  0-5 Units Subcutaneous QHS   ipratropium-albuterol  3 mL Nebulization Q6H   mouth rinse  15 mL Mouth Rinse q12n4p   methylPREDNISolone (SOLU-MEDROL) injection  40 mg Intravenous Daily   metoprolol tartrate  50 mg Oral TID   mometasone-formoterol  2 puff Inhalation BID   multivitamin with minerals  1 tablet Oral Daily   mupirocin ointment  1 application Nasal BID   pantoprazole  40 mg Oral Daily   phenylephrine  1 suppository Rectal BID   Continuous Infusions:  sodium chloride Stopped (09/20/20 2035)   amiodarone 30 mg/hr (09/22/20 0600)   PRN Meds: sodium chloride, acetaminophen **OR** acetaminophen, ipratropium-albuterol, metoprolol tartrate, nitroGLYCERIN, witch hazel-glycerin   Vital Signs    Vitals:   09/22/20 0815 09/22/20 0825 09/22/20 0900 09/22/20 1002  BP:      Pulse: 78 83 98   Resp: 20 18 18    Temp:      TempSrc:      SpO2: 100% 100% 98% 100%  Weight:      Height:        Intake/Output Summary (Last 24 hours)  at 09/22/2020 1009 Last data filed at 09/22/2020 0600 Gross per 24 hour  Intake 460.06 ml  Output 1450 ml  Net -989.94 ml   Last 3 Weights 09/22/2020 09/21/2020 09/20/2020  Weight (lbs) 229 lb 4.5 oz 228 lb 2.8 oz 230 lb 2.6 oz  Weight (kg) 104 kg 103.5 kg 104.4 kg      Telemetry    Atrial fibrillation with rates currently in the 110s but as high as the 130s at times. - Personally Reviewed  ECG    No new ECG tracing today. - Personally Reviewed  Physical Exam   GEN: No acute distress.   Neck: No JVD. Cardiac: Tachycardic with irregularly irregular rhythm. No murmurs, rubs, or gallops.  Respiratory: Increased work of breathing. Decreased/tight breath sounds throughout. Mild crackles in bases as well as expiratory wheezing. GI: Soft, non-distended, and non-tender. MS: 2+ pitting edema of bilateral lower extremities. No deformity. Skin: Warm and dry. Neuro:  No focal deficits. Psych: Normal affect. Responds appropriately.  Labs    High Sensitivity Troponin:   Recent Labs  Lab 09/17/20 1314 09/17/20 1522  TROPONINIHS 35* 31*      Chemistry Recent Labs  Lab 09/17/20 1314 09/18/20 11/18/20 09/19/20 0305 09/20/20 0300 09/21/20 0301 09/22/20 0243  NA 134* 139   < > 136 137 136  K 4.5 4.6   < > 4.5 4.4 4.8  CL 97* 97*   < > 95* 96* 95*  CO2 28 27   < > 29 31 31   GLUCOSE 132* 151*   < > 159* 147* 152*  BUN 25* 28*   < > 47* 50* 54*  CREATININE 1.21 1.28*   < > 1.37* 1.28* 1.50*  CALCIUM 8.2* 8.5*   < > 8.7* 8.8* 8.8*  PROT 7.3 7.1  --   --   --   --   ALBUMIN 3.8 3.6  --   --   --   --   AST 37 37  --   --   --   --   ALT 23 25  --   --   --   --   ALKPHOS 67 57  --   --   --   --   BILITOT 1.5* 1.1  --   --   --   --   GFRNONAA >60 58*   < > 53* 58* 48*  ANIONGAP 9 15   < > 12 10 10    < > = values in this interval not displayed.     Hematology Recent Labs  Lab 09/20/20 0300 09/21/20 0301 09/22/20 0243  WBC 15.6* 15.5* 16.2*  RBC 3.92* 4.07* 4.45  HGB 10.8*  11.3* 12.3*  HCT 34.9* 36.4* 40.1  MCV 89.0 89.4 90.1  MCH 27.6 27.8 27.6  MCHC 30.9 31.0 30.7  RDW 14.1 14.1 13.9  PLT 317 302 318    BNP Recent Labs  Lab 09/17/20 1314  BNP 445.0*     DDimer No results for input(s): DDIMER in the last 168 hours.   Radiology    DG Chest 1 View  Result Date: 09/22/2020 CLINICAL DATA:  Shortness of breath EXAM: CHEST  1 VIEW COMPARISON:  09/19/2020 FINDINGS: Stable cardiomegaly. Persistent streaky opacities at the right lung base with blunting of the right costophrenic angle. No significant interval change from prior. No new airspace opacity. No pneumothorax. IMPRESSION: Persistent streaky opacities at the right lung base with blunting of the right costophrenic angle, not significantly changed from prior. Electronically Signed   By: Duanne GuessNicholas  Plundo D.O.   On: 09/22/2020 08:08    Cardiac Studies   Echocardiogram 09/18/2020: Impressions:  1. Left ventricular ejection fraction by 3D volume is 49 %. The left  ventricle has mildly decreased function. The left ventricle has no  regional wall motion abnormalities. There is mild left ventricular  hypertrophy. Left ventricular diastolic parameters   are indeterminate.   2. Right ventricular systolic function is normal. The right ventricular  size is normal. There is mildly elevated pulmonary artery systolic  pressure. The estimated right ventricular systolic pressure is 40.5 mmHg.   3. Right atrial size was moderately dilated.   4. The mitral valve is normal in structure. Moderate mitral valve  regurgitation. No evidence of mitral stenosis.   5. The aortic valve is normal in structure. Aortic valve regurgitation is  not visualized. Mild to moderate aortic valve sclerosis/calcification is  present, without any evidence of aortic stenosis.   6. The inferior vena cava is normal in size with greater than 50%  respiratory variability, suggesting right atrial pressure of 3 mmHg.    Patient Profile      75 y.o. male with a history of CAD, ischemic cardiomyopathy/chronic systolic CHF, paroxysmal atrial fibrillation not on  anticoagulation at home, iatrogenic hemothorax from bedside thoracentesis in 03/2016 s/p VATS decortication with mechanical pleurodesis at [facility], COPD on home O2, hypertension, hyperlipidemia, type 2 diabetes, GERD, and prior tobacco use (quit 4 years ago) who is being seen 09/18/2020 for the evaluation of paroxysmal atrial fibrillation at the request of Dr. Rhona Leavens.  Assessment & Plan    Paroxysmal Atrial Fibrillation  - Patient presented with worsening dyspnea on exertion for the last couple of months as well as weight gain and edema. Found to be in atrial fibrillation with RVR. Rates initially in the 170s. - Rates still elevated but better controlled today. Currently in the 110s but as high as the 130s at times. - Electrolytes and TSH normal. - Echo showed LVEF of 49% with normal wall motion. - Currently on Lopressor 50mg  three times daily. Will increase to every 6 hours. Will place hold parameters (hold if systolic BP <95). - Continue IV Amiodarone.  - CHA2D2-VASc = 6 (CAD, CHF, HTN, DM, age x2). Started on Eliquis 5mg  twice daily. - If rates do not improve as underlying respiratory condition improves, will likely need to proceed with inpatient TEE/DCCV.  Acute on Chronic Combined CHF - BNP elevated at 445.  - Chest x-ray showed right lung opacities that may reflect pneumonia but no overt edema.  - Echo showed LVEF of 49% with normal wall motion. RV normal. - IV Lasix increased to 80mg  three times daily yesterday. Documented urinary output of 1.9 L yesterday and net negative 3.2 L since admission. No significant change in weight since admission. Mild increase in BUN and creatinine today.  - Still appears volume overloaded. - Continue current dose of IV Lasix.  - Continue Lopressor as above. Transition to Toprol-XL prior to discharge. - Will wait to start ARB/ARNI for now to  allow for more rate control. - Continue to monitor daily weights, strict I/O's, and renal function.   Demand Ischemia History of CAD - History of CAD s/p PCIx2. - High-sensitivity troponin minimally elevated and flat at 35 >>32. Consistent with demand ischemia from atrial fibrillation with RVR and CHF. - No chest pain.  - Echo as above. - On DAPT with Aspirin and Plavix at home. Aspirin stopped now that patient is on Eliquis. - Continue beta-blocker as above.  - Continue high-intensity statin. - No ischemic work-up planned at this time.    Hypertension - BP labile with systolic BP ranging from 50 to 168 over the last 24 hours. Well controlled this morning. - Continue Lopressor as above.    Hyperlipidemia - Continue Lipitor 40mg  daily.   Type 2 Diabetes Mellitus  - Management per primary team.   Hypomagnesemia - Supplemented and improved.   Acute CKD Stage III - Creatinine 1.21 on admission. Creatinine has been up and down. 1.50 today.  - Continue to monitor closely with diuresis.   COPD Exacerbation - Management per primary team.   Obstructive Sleep Apnea  - Continue BiPAP.   Leukocytosis - WBC continue to rise and is 16.2 today. Afebrile. - Procalcitonin negative. - Felt to be secondary to steroids.   - Management per primary team.  For questions or updates, please contact [facility] Please consult [email or web address] for contact info under        Signed, , PA-C  09/22/2020, 10:09 AM    Personally seen and examined. Agree with above.  75 year old with a acute heart failure paroxysmal atrial fibrillation demand ischemia.  Continue with IV amiodarone and Lopressor increased to every  6 hours.  EF 49%. Creatinine slightly increased from 1.21-1.5.  We will continue to monitor closely with diuresis as we did increase his Lasix to 3 times a day yesterday.  Still appears fluid overloaded on exam.  Donato Schultz, MD

## 2020-09-23 DIAGNOSIS — R0602 Shortness of breath: Secondary | ICD-10-CM

## 2020-09-23 DIAGNOSIS — I5043 Acute on chronic combined systolic (congestive) and diastolic (congestive) heart failure: Secondary | ICD-10-CM | POA: Diagnosis not present

## 2020-09-23 DIAGNOSIS — N179 Acute kidney failure, unspecified: Secondary | ICD-10-CM

## 2020-09-23 DIAGNOSIS — J441 Chronic obstructive pulmonary disease with (acute) exacerbation: Secondary | ICD-10-CM | POA: Diagnosis not present

## 2020-09-23 DIAGNOSIS — I4891 Unspecified atrial fibrillation: Secondary | ICD-10-CM | POA: Diagnosis not present

## 2020-09-23 LAB — CBC
HCT: 37.8 % — ABNORMAL LOW (ref 39.0–52.0)
Hemoglobin: 11.6 g/dL — ABNORMAL LOW (ref 13.0–17.0)
MCH: 27.4 pg (ref 26.0–34.0)
MCHC: 30.7 g/dL (ref 30.0–36.0)
MCV: 89.4 fL (ref 80.0–100.0)
Platelets: 285 10*3/uL (ref 150–400)
RBC: 4.23 MIL/uL (ref 4.22–5.81)
RDW: 13.9 % (ref 11.5–15.5)
WBC: 14.5 10*3/uL — ABNORMAL HIGH (ref 4.0–10.5)
nRBC: 0 % (ref 0.0–0.2)

## 2020-09-23 LAB — GLUCOSE, CAPILLARY
Glucose-Capillary: 134 mg/dL — ABNORMAL HIGH (ref 70–99)
Glucose-Capillary: 158 mg/dL — ABNORMAL HIGH (ref 70–99)
Glucose-Capillary: 155 mg/dL — ABNORMAL HIGH (ref 70–99)
Glucose-Capillary: 198 mg/dL — ABNORMAL HIGH (ref 70–99)

## 2020-09-23 LAB — BASIC METABOLIC PANEL
Anion gap: 11 (ref 5–15)
BUN: 54 mg/dL — ABNORMAL HIGH (ref 8–23)
CO2: 34 mmol/L — ABNORMAL HIGH (ref 22–32)
Calcium: 8.6 mg/dL — ABNORMAL LOW (ref 8.9–10.3)
Chloride: 96 mmol/L — ABNORMAL LOW (ref 98–111)
Creatinine, Ser: 1.37 mg/dL — ABNORMAL HIGH (ref 0.61–1.24)
GFR, Estimated: 53 mL/min — ABNORMAL LOW (ref >60)
Glucose, Bld: 141 mg/dL — ABNORMAL HIGH (ref 70–99)
Potassium: 4.1 mmol/L (ref 3.5–5.1)
Sodium: 141 mmol/L (ref 135–145)

## 2020-09-23 LAB — PROCALCITONIN: Procalcitonin: <0.1 ng/mL

## 2020-09-23 LAB — MAGNESIUM: Magnesium: 2.1 mg/dL (ref 1.7–2.4)

## 2020-09-23 MED ORDER — GUAIFENESIN ER 600 MG PO TB12
1200.0000 mg | ORAL_TABLET | Freq: Two times a day (BID) | ORAL | Status: AC
  Administered 2020-09-23 – 2020-09-28 (×10): 1200 mg via ORAL
  Filled 2020-09-23 (×10): qty 2

## 2020-09-23 MED ORDER — BISACODYL 10 MG RE SUPP
10.0000 mg | Freq: Once | RECTAL | Status: AC
  Administered 2020-09-23: 10 mg via RECTAL
  Filled 2020-09-23: qty 1

## 2020-09-23 NOTE — Progress Notes (Addendum)
Progress Note  Patient Name: Dalton Juarez Date of Encounter: 09/23/2020  [facility] Cardiologist: Thurmon FairMihai Croitoru, MD   Subjective   No acute overnight events. Patient states he feels breathing is waxing and waning - sometimes he will feel like he is breathing better and then other times he struggles again. No chest pain. Still in atrial fibrillation but rates slowly improving - 90 to low 110s at rest (up to the 120s with coughing fits and talking).  Inpatient Medications    Scheduled Meds:  apixaban  5 mg Oral BID   atorvastatin  40 mg Oral Daily   chlorhexidine  15 mL Mouth Rinse BID   Chlorhexidine Gluconate Cloth  6 each Topical Daily   cholecalciferol  2,000 Units Oral Daily   clopidogrel  75 mg Oral Daily   dorzolamide  1 drop Left Eye BID   furosemide  80 mg Intravenous TID   gabapentin  300 mg Oral TID   guaiFENesin  600 mg Oral BID   insulin aspart  0-15 Units Subcutaneous TID WC   insulin aspart  0-5 Units Subcutaneous QHS   ipratropium-albuterol  3 mL Nebulization Q6H   mouth rinse  15 mL Mouth Rinse q12n4p   methylPREDNISolone (SOLU-MEDROL) injection  40 mg Intravenous Daily   metoprolol tartrate  50 mg Oral QID   mometasone-formoterol  2 puff Inhalation BID   multivitamin with minerals  1 tablet Oral Daily   pantoprazole  40 mg Oral Daily   phenylephrine  1 suppository Rectal BID   Continuous Infusions:  sodium chloride Stopped (09/20/20 2035)   amiodarone 30 mg/hr (09/23/20 0520)   PRN Meds: sodium chloride, acetaminophen **OR** acetaminophen, guaiFENesin-dextromethorphan, ipratropium-albuterol, metoprolol tartrate, nitroGLYCERIN, witch hazel-glycerin   Vital Signs    Vitals:   09/23/20 0500 09/23/20 0600 09/23/20 0700 09/23/20 0726  BP: 111/61 120/67 101/73   Pulse: (!) 101 91 88   Resp: 20 15 13    Temp:    97.6 F (36.4 C)  TempSrc:    Axillary  SpO2: 99% 99% 99%   Weight: 104.2 kg     Height:        Intake/Output Summary (Last 24 hours)  at 09/23/2020 0749 Last data filed at 09/23/2020 [id number] Gross per 24 hour  Intake 1099.16 ml  Output 2700 ml  Net -1600.84 ml   Last 3 Weights 09/23/2020 09/22/2020 09/21/2020  Weight (lbs) 229 lb 11.5 oz 229 lb 4.5 oz 228 lb 2.8 oz  Weight (kg) 104.2 kg 104 kg 103.5 kg      Telemetry    Atrial fibrillation with rates in the 90s to low 110s at rest (up to the 120s with coughing fits and when talking) - Personally Reviewed  ECG    No new ECG tracing today. - Personally Reviewed  Physical Exam   GEN: No acute distress.   Neck: No JVD Cardiac: Tachycardic with irregularly irregular rhythm. No murmurs, rubs, or gallops.  Respiratory: No significant increased work of breathing. Decreased/tight breath sounds throughout with faint crackles and expiratory wheezing noted in the bases (worse on the right). GI: Soft, non-distended, and non-tender. MS: 2+ pitting edema of bilateral lower extremities. No deformities. Skin: Warm and dry. Neuro:  No focal deficits. Psych: Normal affect. Responds appropriately.  Labs    High Sensitivity Troponin:   Recent Labs  Lab 09/17/20 1314 09/17/20 1522  TROPONINIHS 35* 31*      Chemistry Recent Labs  Lab 09/17/20 1314 09/18/20 0312 09/19/20 0305 09/21/20 0301 09/22/20  0243 09/23/20 0300  NA 134* 139   < > 137 136 141  K 4.5 4.6   < > 4.4 4.8 4.1  CL 97* 97*   < > 96* 95* 96*  CO2 28 27   < > 31 31 34*  GLUCOSE 132* 151*   < > 147* 152* 141*  BUN 25* 28*   < > 50* 54* 54*  CREATININE 1.21 1.28*   < > 1.28* 1.50* 1.37*  CALCIUM 8.2* 8.5*   < > 8.8* 8.8* 8.6*  PROT 7.3 7.1  --   --   --   --   ALBUMIN 3.8 3.6  --   --   --   --   AST 37 37  --   --   --   --   ALT 23 25  --   --   --   --   ALKPHOS 67 57  --   --   --   --   BILITOT 1.5* 1.1  --   --   --   --   GFRNONAA >60 58*   < > 58* 48* 53*  ANIONGAP 9 15   < > 10 10 11    < > = values in this interval not displayed.     Hematology Recent Labs  Lab 09/21/20 0301  09/22/20 0243 09/23/20 0300  WBC 15.5* 16.2* 14.5*  RBC 4.07* 4.45 4.23  HGB 11.3* 12.3* 11.6*  HCT 36.4* 40.1 37.8*  MCV 89.4 90.1 89.4  MCH 27.8 27.6 27.4  MCHC 31.0 30.7 30.7  RDW 14.1 13.9 13.9  PLT 302 318 285    BNP Recent Labs  Lab 09/17/20 1314  BNP 445.0*     DDimer No results for input(s): DDIMER in the last 168 hours.   Radiology    DG Chest 1 View  Result Date: 09/22/2020 CLINICAL DATA:  Shortness of breath EXAM: CHEST  1 VIEW COMPARISON:  09/19/2020 FINDINGS: Stable cardiomegaly. Persistent streaky opacities at the right lung base with blunting of the right costophrenic angle. No significant interval change from prior. No new airspace opacity. No pneumothorax. IMPRESSION: Persistent streaky opacities at the right lung base with blunting of the right costophrenic angle, not significantly changed from prior. Electronically Signed   By: 09/21/2020 D.O.   On: 09/22/2020 08:08    Cardiac Studies   Echocardiogram 09/18/2020: Impressions:  1. Left ventricular ejection fraction by 3D volume is 49 %. The left  ventricle has mildly decreased function. The left ventricle has no  regional wall motion abnormalities. There is mild left ventricular  hypertrophy. Left ventricular diastolic parameters   are indeterminate.   2. Right ventricular systolic function is normal. The right ventricular  size is normal. There is mildly elevated pulmonary artery systolic  pressure. The estimated right ventricular systolic pressure is 40.5 mmHg.   3. Right atrial size was moderately dilated.   4. The mitral valve is normal in structure. Moderate mitral valve  regurgitation. No evidence of mitral stenosis.   5. The aortic valve is normal in structure. Aortic valve regurgitation is  not visualized. Mild to moderate aortic valve sclerosis/calcification is  present, without any evidence of aortic stenosis.   6. The inferior vena cava is normal in size with greater than 50%   respiratory variability, suggesting right atrial pressure of 3 mmHg.  Patient Profile     75 y.o. male with a history of CAD, ischemic cardiomyopathy/chronic systolic CHF, paroxysmal atrial fibrillation  not on anticoagulation at home, iatrogenic hemothorax from bedside thoracentesis in 03/2016 s/p VATS decortication with mechanical pleurodesis at [facility], COPD on home O2, hypertension, hyperlipidemia, type 2 diabetes, GERD, and prior tobacco use (quit 4 years ago) who is being seen 09/18/2020 for the evaluation of paroxysmal atrial fibrillation at the request of Dr. Rhona Leavens.  Assessment & Plan    Paroxysmal Atrial Fibrillation  - Patient presented with worsening dyspnea on exertion for the last couple of months as well as weight gain and edema. Found to be in atrial fibrillation with RVR. Rates initially in the 170s. - Rates improving. In the 90 to 110s at rest but will get up to the 120s during coughing fits and talking. - Electrolytes and TSH normal. - Echo showed LVEF of 49% with normal wall motion. - Continue Lopressor 50mg  every 6 hours.  - Continue IV Amiodarone.  - CHA2D2-VASc = 6 (CAD, CHF, HTN, DM, age x2). Started on Eliquis 5mg  twice daily. - I worry that with his respiratory status, he would not tolerated sedation for TEE/DCCV well. Fortunately rates are improving. Suspect rates will continue to improve as respiratory status improves.   Acute on Chronic Combined CHF - BNP elevated at 445.  - Chest x-ray showed right lung opacities that may reflect pneumonia but no overt edema.  - Echo showed LVEF of 49% with normal wall motion. RV normal. - IV Lasix increased to 80mg  three times daily yesterday. Documented urinary output of 2.7 L yesterday and net negative 4.8 L since admission. No significant change in weight since admission. Renal function stable - creatinine down to 1.37 today. - Still appears volume overloaded. - Continue current dose of IV Lasix.  - Continue Lopressor as above.  Transition to Toprol-XL prior to discharge. - Will wait to start ARB/ARNI for now to allow for more rate control. - Will order TED hose to help with lower extremity edema. - Continue to monitor daily weights, strict I/O's, and renal function.    Demand Ischemia History of CAD - History of CAD s/p PCIx2. - High-sensitivity troponin minimally elevated and flat at 35 >>32. Consistent with demand ischemia from atrial fibrillation with RVR and CHF. - No chest pain.  - Echo as above. - On DAPT with Aspirin and Plavix at home. Aspirin stopped now that patient is on Eliquis. - Continue beta-blocker as above.  - Continue high-intensity statin. - No ischemic work-up planned at this time.    Hypertension - BP soft at times but stable. - Continue Lopressor as above.    Hyperlipidemia - Continue Lipitor 40mg  daily.   Type 2 Diabetes Mellitus  - Management per primary team.   Hypomagnesemia - Supplemented and improved. - Will recheck today.   Acute CKD Stage III - Creatinine 1.21 on admission. Creatinine has been up and down. Stable at 1.37 today. - Continue to monitor closely with diuresis.   COPD Exacerbation - Management per primary team.   Obstructive Sleep Apnea  - Continue BiPAP.   Leukocytosis - WBC 14.5 today. Afebrile. - Procalcitonin negative. - Felt to be secondary to steroids.   - Management per primary team.  For questions or updates, please contact [facility] Please consult [email or web address] for contact info under        Signed, , PA-C  09/23/2020, 7:49 AM    Personally seen and examined. Agree with above.  Still not feeling great.  No chest pain.  Atrial fibrillation mostly in the 100 range.  Telemetry reviewed.  Still has lower extremity edema about 2+.  Creatinine has remained fairly stable between 1.28 and 1.37.  BNP previously 445.  Echocardiogram showed EF of 49%.  Assessment and plan:  Persistent atrial fibrillation - Improved  rate control with metoprolol 50 mg every 6 hours, IV amiodarone. - Continue with Eliquis 5 mg twice a day for anticoagulation with CHA2DS2-VASc score of 6. - I agree that he would not tolerate sedation for TEE cardioversion well given his overall respiratory status.  I think that since we are adequately rate controlling him that we will continue with this plan for rate control.  Acute on chronic systolic/diastolic heart failure - We previously increased his Lasix to 80 mg 3 times a day.  Creatinine remaining stable.  Still remains volume overloaded on exam.  Continue. - Agree with transition to Toprol-XL when closer to discharge.  Holding off on Entresto etc.  CAD with demand ischemia - Prior PCI x2 in the past.  Flat low-level troponin.  Demand ischemia compatible with atrial fibrillation RVR as well as heart failure.  Aspirin was stopped given the Eliquis use.  Watch for any signs of bleeding.  Donato Schultz, MD

## 2020-09-23 NOTE — Progress Notes (Signed)
[facility]  PROGRESS NOTE  Dalton Juarez XBM:[id number] DOB: [birth year] DOA: 09/17/2020 PCP: Smith RobertKikel, Stephen, MD   Brief HPI:   75 year old male with history of COPD on 2 L/min of oxygen via nasal cannula, diabetes mellitus type 2, CHF/ischemic cardiomyopathy, atrial fibrillation presented to ED with 2 to 3 weeks history of progressively worsening shortness of breath, 10 pound weight gain. In the ED he was found to be tachycardic, with A. fib with RVR.  Cardizem drip was started.  Chest x-ray showed bilateral patchy opacity was placed on Levaquin.  Cardiology was consulted    Subjective   Patient seen and examined, still coughing up phlegm.  Breathing is back to baseline, as per patient.   Assessment/Plan:    Acute on chronic combined systolic and diastolic CHF -Presented with BNP of 445; echo shows EF 49% -Started on Lasix 80 mg IV 3 times daily -Net -5.4 L -Continue to monitor intake and output -cardiology following  Atrial fibrillation with RVR -Heart rate is controlled -Continue Lopressor 50 mg 3 times daily -Continue IV amiodarone -TSH is normal -Started on Eliquis for anticoagulation  Positive troponin -Mild elevation of troponin.  35>> 32 -Likely demand ischemia -No ischemic work-up planned per cardiology -He was on DAPT with aspirin and Plavix at home -Aspirin stopped as patient is on Eliquis   Hypertension -Blood pressure is stable  COPD exacerbation -Continue DuoNeb nebulizer, Solu-Medrol -Add flutter valve every 4 hours -Continue Mucinex  Acute kidney injury -In setting of diuresis; creatinine is 1.37 -We will closely monitor patient's creatinine  Diabetes mellitus type 2 -Continue sliding scale insulin with NovoLog -CBG well controlled   Scheduled medications:    apixaban  5 mg Oral BID   atorvastatin  40 mg Oral Daily   bisacodyl  10 mg Rectal Once   chlorhexidine  15 mL Mouth Rinse BID   Chlorhexidine Gluconate Cloth  6 each Topical  Daily   cholecalciferol  2,000 Units Oral Daily   clopidogrel  75 mg Oral Daily   dorzolamide  1 drop Left Eye BID   furosemide  80 mg Intravenous TID   gabapentin  300 mg Oral TID   guaiFENesin  1,200 mg Oral BID   insulin aspart  0-15 Units Subcutaneous TID WC   insulin aspart  0-5 Units Subcutaneous QHS   ipratropium-albuterol  3 mL Nebulization Q6H   mouth rinse  15 mL Mouth Rinse q12n4p   methylPREDNISolone (SOLU-MEDROL) injection  40 mg Intravenous Daily   metoprolol tartrate  50 mg Oral QID   mometasone-formoterol  2 puff Inhalation BID   multivitamin with minerals  1 tablet Oral Daily   pantoprazole  40 mg Oral Daily   phenylephrine  1 suppository Rectal BID         Data Reviewed:   CBG:  Recent Labs  Lab 09/22/20 1630 09/22/20 1825 09/22/20 2139 09/23/20 0724 09/23/20 1143  GLUCAP 208* 186* 159* 134* 158*    SpO2: 99 % O2 Flow Rate (L/min): 5 L/min FiO2 (%): 30 %    Vitals:   09/23/20 0931 09/23/20 1145 09/23/20 1458 09/23/20 1500  BP: (!) 148/95     Pulse: (!) 116     Resp:      Temp:  98.3 F (36.8 C) (!) 97.5 F (36.4 C)   TempSrc:  Oral Oral   SpO2:    99%  Weight:      Height:         Intake/Output Summary (Last 24 hours) at  09/23/2020 1618 Last data filed at 09/23/2020 1459 Gross per 24 hour  Intake 949.17 ml  Output 2700 ml  Net -1750.83 ml    08/15 1901 - 08/17 0700 In: 1400.6 [P.O.:720; I.V.:680.6] Out: 3350 [Urine:3350]  Filed Weights   09/21/20 0500 09/22/20 0500 09/23/20 0500  Weight: 103.5 kg 104 kg 104.2 kg    CBC:  Recent Labs  Lab 09/17/20 1314 09/18/20 0312 09/19/20 0305 09/20/20 0300 09/21/20 0301 09/22/20 0243 09/23/20 0300  WBC 14.8*   < > 15.1* 15.6* 15.5* 16.2* 14.5*  HGB 12.5*   < > 12.4* 10.8* 11.3* 12.3* 11.6*  HCT 39.2   < > 39.1 34.9* 36.4* 40.1 37.8*  PLT 350   < > 263 317 302 318 285  MCV 89.9   < > 88.5 89.0 89.4 90.1 89.4  MCH 28.7   < > 28.1 27.6 27.8 27.6 27.4  MCHC 31.9   < > 31.7 30.9  31.0 30.7 30.7  RDW 14.0   < > 14.0 14.1 14.1 13.9 13.9  LYMPHSABS 3.3  --   --   --   --   --   --   MONOABS 1.3*  --   --   --   --   --   --   EOSABS 0.1  --   --   --   --   --   --   BASOSABS 0.1  --   --   --   --   --   --    < > = values in this interval not displayed.    Complete metabolic panel:  Recent Labs  Lab 09/17/20 1314 09/17/20 1342 09/17/20 1522 09/18/20 0312 09/18/20 1254 09/19/20 0305 09/20/20 0300 09/21/20 0301 09/22/20 0243 09/23/20 0300  NA 134*  --   --  139  --  136 136 137 136 141  K 4.5  --   --  4.6  --  4.3 4.5 4.4 4.8 4.1  CL 97*  --   --  97*  --  95* 95* 96* 95* 96*  CO2 28  --   --  27  --  29 29 31 31  34*  GLUCOSE 132*  --   --  151*  --  163* 159* 147* 152* 141*  BUN 25*  --   --  28*  --  39* 47* 50* 54* 54*  CREATININE 1.21  --   --  1.28*  --  1.57* 1.37* 1.28* 1.50* 1.37*  CALCIUM 8.2*  --   --  8.5*  --  8.5* 8.7* 8.8* 8.8* 8.6*  AST 37  --   --  37  --   --   --   --   --   --   ALT 23  --   --  25  --   --   --   --   --   --   ALKPHOS 67  --   --  57  --   --   --   --   --   --   BILITOT 1.5*  --   --  1.1  --   --   --   --   --   --   ALBUMIN 3.8  --   --  3.6  --   --   --   --   --   --   MG 1.2*  --   --   --  1.6*  --   --  2.2  --  2.1  PROCALCITON  --   --   --   --  <0.10  --   --  <0.10 <0.10 <0.10  LATICACIDVEN  --  1.6 1.3  --   --   --   --   --   --   --   INR 1.1  --   --   --   --   --   --   --   --   --   TSH  --   --   --   --  0.797  --   --   --   --   --   HGBA1C 6.3*  --   --   --   --   --   --   --   --   --   BNP 445.0*  --   --   --   --   --   --   --   --   --     No results for input(s): LIPASE, AMYLASE in the last 168 hours.  Recent Labs  Lab 09/17/20 1314 09/17/20 1800 09/18/20 1254 09/21/20 0301 09/22/20 0243 09/23/20 0300  BNP 445.0*  --   --   --   --   --   PROCALCITON  --   --  <0.10 <0.10 <0.10 <0.10  SARSCOV2NAA  --  NEGATIVE  --   --   --   --      ------------------------------------------------------------------------------------------------------------------ No results for input(s): CHOL, HDL, LDLCALC, TRIG, CHOLHDL, LDLDIRECT in the last 72 hours.  Lab Results  Component Value Date   HGBA1C 6.3 (H) 09/17/2020   ------------------------------------------------------------------------------------------------------------------ No results for input(s): TSH, T4TOTAL, T3FREE, THYROIDAB in the last 72 hours.  Invalid input(s): FREET3 ------------------------------------------------------------------------------------------------------------------ No results for input(s): VITAMINB12, FOLATE, FERRITIN, TIBC, IRON, RETICCTPCT in the last 72 hours.  Coagulation profile Recent Labs  Lab 09/17/20 1314  INR 1.1   No results for input(s): DDIMER in the last 72 hours.  Cardiac Enzymes No results for input(s): CKTOTAL, CKMB, CKMBINDEX, TROPONINI in the last 168 hours.  ------------------------------------------------------------------------------------------------------------------    Component Value Date/Time   BNP 445.0 (H) 09/17/2020 1314     Antibiotics: Anti-infectives (From admission, onward)    Start     Dose/Rate Route Frequency Ordered Stop   09/17/20 1415  levofloxacin (LEVAQUIN) IVPB 500 mg        500 mg 100 mL/hr over 60 Minutes Intravenous  Once 09/17/20 1400 09/17/20 1818        Radiology Reports  DG Chest 1 View  Result Date: 09/22/2020 CLINICAL DATA:  Shortness of breath EXAM: CHEST  1 VIEW COMPARISON:  09/19/2020 FINDINGS: Stable cardiomegaly. Persistent streaky opacities at the right lung base with blunting of the right costophrenic angle. No significant interval change from prior. No new airspace opacity. No pneumothorax. IMPRESSION: Persistent streaky opacities at the right lung base with blunting of the right costophrenic angle, not significantly changed from prior. Electronically Signed   By: Duanne Guess D.O.   On: 09/22/2020 08:08      DVT prophylaxis: Apixaban  Code Status: Full code  Family Communication: No family at bedside   Consultants: Cardiology  Procedures:     Objective    Physical Examination:  General-appears in no acute distress Heart-S1-S2,  no murmur auscultated Lungs-bilateral rhonchi auscultated Abdomen-soft, nontender, no organomegaly Extremities-no edema in the lower extremities Neuro-alert, oriented  x3, no focal deficit noted  Status is: Inpatient  Dispo: The patient is from: Home              Anticipated d/c is to: Home              Anticipated d/c date is: 09/27/2020              Patient currently currently not stable for discharge  Barrier to discharge-ongoing treatment for A. fib with RVR  COVID-19 Labs  No results for input(s): DDIMER, FERRITIN, LDH, CRP in the last 72 hours.  Lab Results  Component Value Date   SARSCOV2NAA NEGATIVE 09/17/2020    Microbiology  Recent Results (from the past 240 hour(s))  Resp Panel by RT-PCR (Flu A&B, Covid) Nasopharyngeal Swab     Status: None   Collection Time: 09/17/20  6:00 PM   Specimen: Nasopharyngeal Swab; Nasopharyngeal(NP) swabs in vial transport medium  Result Value Ref Range Status   SARS Coronavirus 2 by RT PCR NEGATIVE NEGATIVE Final    Comment: (NOTE) SARS-CoV-2 target nucleic acids are NOT DETECTED.  The SARS-CoV-2 RNA is generally detectable in upper respiratory specimens during the acute phase of infection. The lowest concentration of SARS-CoV-2 viral copies this assay can detect is 138 copies/mL. A negative result does not preclude SARS-Cov-2 infection and should not be used as the sole basis for treatment or other patient management decisions. A negative result may occur with  improper specimen collection/handling, submission of specimen other than nasopharyngeal swab, presence of viral mutation(s) within the areas targeted by this assay, and inadequate number of  viral copies(<138 copies/mL). A negative result must be combined with clinical observations, patient history, and epidemiological information. The expected result is Negative.  Fact Sheet for Patients:  BloggerCourse.com  Fact Sheet for Healthcare Providers:  SeriousBroker.it  This test is no t yet approved or cleared by the [place] FDA and  has been authorized for detection and/or diagnosis of SARS-CoV-2 by FDA under an Emergency Use Authorization (EUA). This EUA will remain  in effect (meaning this test can be used) for the duration of the COVID-19 declaration under Section 564(b)(1) of the Act, 21 U.S.C.section 360bbb-3(b)(1), unless the authorization is terminated  or revoked sooner.       Influenza A by PCR NEGATIVE NEGATIVE Final   Influenza B by PCR NEGATIVE NEGATIVE Final    Comment: (NOTE) The Xpert Xpress SARS-CoV-2/FLU/RSV plus assay is intended as an aid in the diagnosis of influenza from Nasopharyngeal swab specimens and should not be used as a sole basis for treatment. Nasal washings and aspirates are unacceptable for Xpert Xpress SARS-CoV-2/FLU/RSV testing.  Fact Sheet for Patients: BloggerCourse.com  Fact Sheet for Healthcare Providers: SeriousBroker.it  This test is not yet approved or cleared by the [place] FDA and has been authorized for detection and/or diagnosis of SARS-CoV-2 by FDA under an Emergency Use Authorization (EUA). This EUA will remain in effect (meaning this test can be used) for the duration of the COVID-19 declaration under Section 564(b)(1) of the Act, 21 U.S.C. section 360bbb-3(b)(1), unless the authorization is terminated or revoked.  Performed at [facility], [address], [place], [place] [address]   MRSA Next Gen by PCR, Nasal     Status: Abnormal   Collection Time: 09/18/20 12:17 AM   Specimen: Nasal Mucosa; Nasal  Swab  Result Value Ref Range Status   MRSA by PCR Next Gen DETECTED (A) NOT DETECTED Final    Comment: RESULT CALLED TO,  READ BACK BY AND VERIFIED WITH: Duwayne Heck, RN @ [phone] ON 09/18/20 C VARNER (NOTE) The GeneXpert MRSA Assay (FDA approved for NASAL specimens only), is one component of a comprehensive MRSA colonization surveillance program. It is not intended to diagnose MRSA infection nor to guide or monitor treatment for MRSA infections. Test performance is not FDA approved in patients less than 88 years old. Performed at [facility], [address] [place], [place] [address]              Meredeth Ide   [facility] If 7PM-7AM, please contact night-coverage at [email or web address], Office  [phone]   09/23/2020, 4:18 PM  LOS: 6 days

## 2020-09-23 NOTE — TOC Progression Note (Signed)
Transition of Care [facility]) - Progression Note    Patient Details  Name: Ketih Goodie MRN: [id number] Date of Birth: [birth year]  [facility]) CM/SW Contact  Golda Acre, RN Phone Number: 09/23/2020, 7:44 AM  Clinical Narrative:    On bipap at night, iv solu medrol, iv amiodarone drip for a.fib with rvr. Aki-bun 54 creat 1.37, wbc 14.5, diastolic and systolic chf. TOC PLAN OF CARE: Following for progression plan is to return to home, [facility].        Expected Discharge Plan and Services                                                 Social Determinants of Health (SDOH) Interventions    Readmission Risk Interventions No flowsheet data found.

## 2020-09-24 DIAGNOSIS — J441 Chronic obstructive pulmonary disease with (acute) exacerbation: Secondary | ICD-10-CM | POA: Diagnosis not present

## 2020-09-24 DIAGNOSIS — I5043 Acute on chronic combined systolic (congestive) and diastolic (congestive) heart failure: Secondary | ICD-10-CM | POA: Diagnosis not present

## 2020-09-24 DIAGNOSIS — I4891 Unspecified atrial fibrillation: Secondary | ICD-10-CM | POA: Diagnosis not present

## 2020-09-24 LAB — CBC
HCT: 39.6 % (ref 39.0–52.0)
Hemoglobin: 12.2 g/dL — ABNORMAL LOW (ref 13.0–17.0)
MCH: 27.5 pg (ref 26.0–34.0)
MCHC: 30.8 g/dL (ref 30.0–36.0)
MCV: 89.2 fL (ref 80.0–100.0)
Platelets: 305 10*3/uL (ref 150–400)
RBC: 4.44 MIL/uL (ref 4.22–5.81)
RDW: 14 % (ref 11.5–15.5)
WBC: 17.6 10*3/uL — ABNORMAL HIGH (ref 4.0–10.5)
nRBC: 0 % (ref 0.0–0.2)

## 2020-09-24 LAB — GLUCOSE, CAPILLARY
Glucose-Capillary: 123 mg/dL — ABNORMAL HIGH (ref 70–99)
Glucose-Capillary: 191 mg/dL — ABNORMAL HIGH (ref 70–99)
Glucose-Capillary: 232 mg/dL — ABNORMAL HIGH (ref 70–99)
Glucose-Capillary: 224 mg/dL — ABNORMAL HIGH (ref 70–99)

## 2020-09-24 LAB — BASIC METABOLIC PANEL
Anion gap: 11 (ref 5–15)
BUN: 48 mg/dL — ABNORMAL HIGH (ref 8–23)
CO2: 38 mmol/L — ABNORMAL HIGH (ref 22–32)
Calcium: 8.7 mg/dL — ABNORMAL LOW (ref 8.9–10.3)
Chloride: 92 mmol/L — ABNORMAL LOW (ref 98–111)
Creatinine, Ser: 1.25 mg/dL — ABNORMAL HIGH (ref 0.61–1.24)
GFR, Estimated: 59 mL/min — ABNORMAL LOW (ref >60)
Glucose, Bld: 135 mg/dL — ABNORMAL HIGH (ref 70–99)
Potassium: 3.9 mmol/L (ref 3.5–5.1)
Sodium: 141 mmol/L (ref 135–145)

## 2020-09-24 MED ORDER — TAMSULOSIN HCL 0.4 MG PO CAPS
0.4000 mg | ORAL_CAPSULE | Freq: Every day | ORAL | Status: DC
  Administered 2020-09-24 – 2020-10-01 (×8): 0.4 mg via ORAL
  Filled 2020-09-24 (×8): qty 1

## 2020-09-24 MED ORDER — HYDROCORTISONE ACETATE 25 MG RE SUPP
25.0000 mg | Freq: Two times a day (BID) | RECTAL | Status: DC
  Administered 2020-09-24 – 2020-10-01 (×17): 25 mg via RECTAL
  Filled 2020-09-24 (×16): qty 1

## 2020-09-24 NOTE — Progress Notes (Signed)
PT Cancellation Note  Patient Details Name: Dalton Juarez MRN: [id number] DOB: 06/10/1945   Cancelled Treatment:    Reason Eval/Treat Not Completed: Other (comment)has been in recliner with nursing assistance. Now in bed and on BiPAP. Will check back tomorrow.   Rada Hay 09/24/2020, 5:47 PM Blanchard Kelch PT Acute Rehabilitation Services Pager [phone] Office [phone]

## 2020-09-24 NOTE — Progress Notes (Addendum)
[facility]  PROGRESS NOTE  Olam Idlerrnold Barrington HYQ:[id number] DOB: 10/17/1945 DOA: 09/17/2020 PCP: Smith RobertKikel, Stephen, MD   Brief HPI:   75 year old male with history of COPD on 2 L/min of oxygen via nasal cannula, diabetes mellitus type 2, CHF/ischemic cardiomyopathy, atrial fibrillation presented to ED with 2 to 3 weeks history of progressively worsening shortness of breath, 10 pound weight gain. In the ED he was found to be tachycardic, with A. fib with RVR.  Cardizem drip was started.  Chest x-ray showed bilateral patchy opacity was placed on Levaquin.  Cardiology was consulted    Subjective   Patient seen and examined, complains of abdominal distention, has not voided today.  Also complains of hemorrhoidal itching.   Assessment/Plan:    Acute on chronic combined systolic and diastolic CHF -Presented with BNP of 445; echo shows EF 49% -Started on Lasix 80 mg IV 3 times daily -Net - 7.8 L -Continue to monitor intake and output -cardiology following  Atrial fibrillation with RVR -Heart rate is controlled -Continue Lopressor 50 mg 3 times daily -Continue IV amiodarone -TSH is normal -Started on Eliquis for anticoagulation  Positive troponin -Mild elevation of troponin.  35>> 32 -Likely demand ischemia -No ischemic work-up planned per cardiology -He was on DAPT with aspirin and Plavix at home -Aspirin stopped as patient is on Eliquis  Hypertension -Blood pressure is stable  COPD exacerbation -Continue DuoNeb nebulizer, Solu-Medrol -Add flutter valve every 4 hours -Continue Mucinex, Solu-Medrol 40 mg IV daily  Acute kidney injury -In setting of diuresis; creatinine is 1.37 -We will closely monitor patient's creatinine  Diabetes mellitus type 2 -Continue sliding scale insulin with NovoLog -CBG well controlled at  Hemorrhoids -We will start Anusol HC suppository twice a day  Urinary retention -Start Flomax 0.4 mg daily -In and out cath as  needed  Leukocytosis -WBC 17,000 ;afebrile, no signs of infection -Likely from steroids   Scheduled medications:    apixaban  5 mg Oral BID   atorvastatin  40 mg Oral Daily   chlorhexidine  15 mL Mouth Rinse BID   Chlorhexidine Gluconate Cloth  6 each Topical Daily   cholecalciferol  2,000 Units Oral Daily   clopidogrel  75 mg Oral Daily   dorzolamide  1 drop Left Eye BID   furosemide  80 mg Intravenous TID   gabapentin  300 mg Oral TID   guaiFENesin  1,200 mg Oral BID   hydrocortisone  25 mg Rectal BID   insulin aspart  0-15 Units Subcutaneous TID WC   insulin aspart  0-5 Units Subcutaneous QHS   ipratropium-albuterol  3 mL Nebulization Q6H   mouth rinse  15 mL Mouth Rinse q12n4p   methylPREDNISolone (SOLU-MEDROL) injection  40 mg Intravenous Daily   metoprolol tartrate  50 mg Oral QID   mometasone-formoterol  2 puff Inhalation BID   multivitamin with minerals  1 tablet Oral Daily   pantoprazole  40 mg Oral Daily   phenylephrine  1 suppository Rectal BID   tamsulosin  0.4 mg Oral Daily         Data Reviewed:   CBG:  Recent Labs  Lab 09/23/20 1143 09/23/20 1646 09/23/20 2103 09/24/20 0722 09/24/20 1120  GLUCAP 158* 155* 198* 123* 191*    SpO2: 100 % O2 Flow Rate (L/min): 5 L/min FiO2 (%): 30 %    Vitals:   09/24/20 0900 09/24/20 1000 09/24/20 1100 09/24/20 1122  BP: (!) 155/104 (!) 144/81 (!) 152/77   Pulse: (!) 25 (!) 111 (!)  123 80  Resp: (!) 24 20 (!) 25 (!) 25  Temp:    98 F (36.7 C)  TempSrc:    Oral  SpO2: 95% 97% 99% 100%  Weight:      Height:         Intake/Output Summary (Last 24 hours) at 09/24/2020 1355 Last data filed at 09/24/2020 1300 Gross per 24 hour  Intake 890.32 ml  Output 3076 ml  Net -2185.68 ml    08/16 1901 - 08/18 0700 In: 1296.8 [P.O.:720; I.V.:576.8] Out: 4651 [Urine:4650]  Filed Weights   09/22/20 0500 09/23/20 0500 09/24/20 0500  Weight: 104 kg 104.2 kg 104.2 kg    CBC:  Recent Labs  Lab  09/20/20 0300 09/21/20 0301 09/22/20 0243 09/23/20 0300 09/24/20 0244  WBC 15.6* 15.5* 16.2* 14.5* 17.6*  HGB 10.8* 11.3* 12.3* 11.6* 12.2*  HCT 34.9* 36.4* 40.1 37.8* 39.6  PLT 317 302 318 285 305  MCV 89.0 89.4 90.1 89.4 89.2  MCH 27.6 27.8 27.6 27.4 27.5  MCHC 30.9 31.0 30.7 30.7 30.8  RDW 14.1 14.1 13.9 13.9 14.0    Complete metabolic panel:  Recent Labs  Lab 09/17/20 1522 09/18/20 0312 09/18/20 1254 09/19/20 0305 09/20/20 0300 09/21/20 0301 09/22/20 0243 09/23/20 0300 09/24/20 0244  NA  --  139  --    < > 136 137 136 141 141  K  --  4.6  --    < > 4.5 4.4 4.8 4.1 3.9  CL  --  97*  --    < > 95* 96* 95* 96* 92*  CO2  --  27  --    < > 29 31 31  34* 38*  GLUCOSE  --  151*  --    < > 159* 147* 152* 141* 135*  BUN  --  28*  --    < > 47* 50* 54* 54* 48*  CREATININE  --  1.28*  --    < > 1.37* 1.28* 1.50* 1.37* 1.25*  CALCIUM  --  8.5*  --    < > 8.7* 8.8* 8.8* 8.6* 8.7*  AST  --  37  --   --   --   --   --   --   --   ALT  --  25  --   --   --   --   --   --   --   ALKPHOS  --  57  --   --   --   --   --   --   --   BILITOT  --  1.1  --   --   --   --   --   --   --   ALBUMIN  --  3.6  --   --   --   --   --   --   --   MG  --   --  1.6*  --   --  2.2  --  2.1  --   PROCALCITON  --   --  <0.10  --   --  <0.10 <0.10 <0.10  --   LATICACIDVEN 1.3  --   --   --   --   --   --   --   --   TSH  --   --  0.797  --   --   --   --   --   --    < > = values  in this interval not displayed.    No results for input(s): LIPASE, AMYLASE in the last 168 hours.  Recent Labs  Lab 09/17/20 1800 09/18/20 1254 09/21/20 0301 09/22/20 0243 09/23/20 0300  PROCALCITON  --  <0.10 <0.10 <0.10 <0.10  SARSCOV2NAA NEGATIVE  --   --   --   --     ------------------------------------------------------------------------------------------------------------------ No results for input(s): CHOL, HDL, LDLCALC, TRIG, CHOLHDL, LDLDIRECT in the last 72 hours.  Lab Results  Component Value  Date   HGBA1C 6.3 (H) 09/17/2020   ------------------------------------------------------------------------------------------------------------------ No results for input(s): TSH, T4TOTAL, T3FREE, THYROIDAB in the last 72 hours.  Invalid input(s): FREET3 ------------------------------------------------------------------------------------------------------------------ No results for input(s): VITAMINB12, FOLATE, FERRITIN, TIBC, IRON, RETICCTPCT in the last 72 hours.  Coagulation profile No results for input(s): INR, PROTIME in the last 168 hours.  No results for input(s): DDIMER in the last 72 hours.  Cardiac Enzymes No results for input(s): CKTOTAL, CKMB, CKMBINDEX, TROPONINI in the last 168 hours.  ------------------------------------------------------------------------------------------------------------------    Component Value Date/Time   BNP 445.0 (H) 09/17/2020 1314     Antibiotics: Anti-infectives (From admission, onward)    Start     Dose/Rate Route Frequency Ordered Stop   09/17/20 1415  levofloxacin (LEVAQUIN) IVPB 500 mg        500 mg 100 mL/hr over 60 Minutes Intravenous  Once 09/17/20 1400 09/17/20 1818        Radiology Reports  No results found.    DVT prophylaxis: Apixaban  Code Status: Full code  Family Communication: discussed with wife at bedside   Consultants: Cardiology  Procedures:     Objective    Physical Examination:  General-appears in no acute distress Heart-S1-S2, regular, no murmur auscultated Lungs- bilateral rhonchi auscultated Abdomen-soft, nontender, no organomegaly Extremities- trace edema in lower extremities Neuro-alert, oriented x3, no focal deficit noted  Status is: Inpatient  Dispo: The patient is from: Home              Anticipated d/c is to: Home              Anticipated d/c date is: 09/27/2020              Patient currently currently not stable for discharge  Barrier to discharge-ongoing treatment for  A. fib with RVR  COVID-19 Labs  No results for input(s): DDIMER, FERRITIN, LDH, CRP in the last 72 hours.  Lab Results  Component Value Date   SARSCOV2NAA NEGATIVE 09/17/2020    Microbiology  Recent Results (from the past 240 hour(s))  Resp Panel by RT-PCR (Flu A&B, Covid) Nasopharyngeal Swab     Status: None   Collection Time: 09/17/20  6:00 PM   Specimen: Nasopharyngeal Swab; Nasopharyngeal(NP) swabs in vial transport medium  Result Value Ref Range Status   SARS Coronavirus 2 by RT PCR NEGATIVE NEGATIVE Final    Comment: (NOTE) SARS-CoV-2 target nucleic acids are NOT DETECTED.  The SARS-CoV-2 RNA is generally detectable in upper respiratory specimens during the acute phase of infection. The lowest concentration of SARS-CoV-2 viral copies this assay can detect is 138 copies/mL. A negative result does not preclude SARS-Cov-2 infection and should not be used as the sole basis for treatment or other patient management decisions. A negative result may occur with  improper specimen collection/handling, submission of specimen other than nasopharyngeal swab, presence of viral mutation(s) within the areas targeted by this assay, and inadequate number of viral copies(<138 copies/mL). A negative result must be combined with clinical observations, patient history,  and epidemiological information. The expected result is Negative.  Fact Sheet for Patients:  BloggerCourse.com  Fact Sheet for Healthcare Providers:  SeriousBroker.it  This test is no t yet approved or cleared by the [place] FDA and  has been authorized for detection and/or diagnosis of SARS-CoV-2 by FDA under an Emergency Use Authorization (EUA). This EUA will remain  in effect (meaning this test can be used) for the duration of the COVID-19 declaration under Section 564(b)(1) of the Act, 21 U.S.C.section 360bbb-3(b)(1), unless the authorization is terminated  or  revoked sooner.       Influenza A by PCR NEGATIVE NEGATIVE Final   Influenza B by PCR NEGATIVE NEGATIVE Final    Comment: (NOTE) The Xpert Xpress SARS-CoV-2/FLU/RSV plus assay is intended as an aid in the diagnosis of influenza from Nasopharyngeal swab specimens and should not be used as a sole basis for treatment. Nasal washings and aspirates are unacceptable for Xpert Xpress SARS-CoV-2/FLU/RSV testing.  Fact Sheet for Patients: BloggerCourse.com  Fact Sheet for Healthcare Providers: SeriousBroker.it  This test is not yet approved or cleared by the [place] FDA and has been authorized for detection and/or diagnosis of SARS-CoV-2 by FDA under an Emergency Use Authorization (EUA). This EUA will remain in effect (meaning this test can be used) for the duration of the COVID-19 declaration under Section 564(b)(1) of the Act, 21 U.S.C. section 360bbb-3(b)(1), unless the authorization is terminated or revoked.  Performed at [facility], [address], [place], [place] [address]   MRSA Next Gen by PCR, Nasal     Status: Abnormal   Collection Time: 09/18/20 12:17 AM   Specimen: Nasal Mucosa; Nasal Swab  Result Value Ref Range Status   MRSA by PCR Next Gen DETECTED (A) NOT DETECTED Final    Comment: RESULT CALLED TO, READ BACK BY AND VERIFIED WITH: DANIELLE, RN @ [phone] ON 09/18/20 C VARNER (NOTE) The GeneXpert MRSA Assay (FDA approved for NASAL specimens only), is one component of a comprehensive MRSA colonization surveillance program. It is not intended to diagnose MRSA infection nor to guide or monitor treatment for MRSA infections. Test performance is not FDA approved in patients less than 69 years old. Performed at [facility], [address] [place], [place] [address]          Meredeth Ide   [facility] If 7PM-7AM, please contact night-coverage at [email or web address], Office   [phone]   09/24/2020, 1:55 PM  LOS: 7 days

## 2020-09-24 NOTE — Progress Notes (Signed)
Progress Note  Patient Name: Dalton Juarez Date of Encounter: 09/24/2020  [facility] Cardiologist: Thurmon Fair, MD   Subjective   No CP. Periodic SOB.   Inpatient Medications    Scheduled Meds:  apixaban  5 mg Oral BID   atorvastatin  40 mg Oral Daily   chlorhexidine  15 mL Mouth Rinse BID   Chlorhexidine Gluconate Cloth  6 each Topical Daily   cholecalciferol  2,000 Units Oral Daily   clopidogrel  75 mg Oral Daily   dorzolamide  1 drop Left Eye BID   furosemide  80 mg Intravenous TID   gabapentin  300 mg Oral TID   guaiFENesin  1,200 mg Oral BID   insulin aspart  0-15 Units Subcutaneous TID WC   insulin aspart  0-5 Units Subcutaneous QHS   ipratropium-albuterol  3 mL Nebulization Q6H   mouth rinse  15 mL Mouth Rinse q12n4p   methylPREDNISolone (SOLU-MEDROL) injection  40 mg Intravenous Daily   metoprolol tartrate  50 mg Oral QID   mometasone-formoterol  2 puff Inhalation BID   multivitamin with minerals  1 tablet Oral Daily   pantoprazole  40 mg Oral Daily   phenylephrine  1 suppository Rectal BID   Continuous Infusions:  sodium chloride Stopped (09/20/20 2035)   amiodarone 30 mg/hr (09/24/20 0600)   PRN Meds: sodium chloride, acetaminophen **OR** acetaminophen, guaiFENesin-dextromethorphan, ipratropium-albuterol, metoprolol tartrate, nitroGLYCERIN, witch hazel-glycerin   Vital Signs    Vitals:   09/24/20 0000 09/24/20 0400 09/24/20 0500 09/24/20 0600  BP: 127/85 136/86  (!) 131/96  Pulse: 86 (!) 107  (!) 25  Resp: 19 17  19   Temp: (!) 96.1 F (35.6 C) 98.4 F (36.9 C)    TempSrc: Axillary Oral    SpO2: 98% 99%  100%  Weight:   104.2 kg   Height:        Intake/Output Summary (Last 24 hours) at 09/24/2020 0755 Last data filed at 09/24/2020 0600 Gross per 24 hour  Intake 1130.32 ml  Output 3801 ml  Net -2670.68 ml   Last 3 Weights 09/24/2020 09/23/2020 09/22/2020  Weight (lbs) 229 lb 11.5 oz 229 lb 11.5 oz 229 lb 4.5 oz  Weight (kg) 104.2 kg  104.2 kg 104 kg      Telemetry    AFIB 80-100 - Personally Reviewed  ECG    No new tracings - Personally Reviewed  Physical Exam   GEN: No acute distress.   Neck: No JVD Cardiac: Irreg, no murmurs, rubs, or gallops.  Respiratory: Clear to auscultation bilaterally. GI: Soft, nontender, non-distended  MS: 1+ BL edema; No deformity. Neuro:  Nonfocal  Psych: Normal affect   Labs    High Sensitivity Troponin:   Recent Labs  Lab 09/17/20 1314 09/17/20 1522  TROPONINIHS 35* 31*      Chemistry Recent Labs  Lab 09/17/20 1314 09/18/20 0312 09/19/20 0305 09/22/20 0243 09/23/20 0300 09/24/20 0244  NA 134* 139   < > 136 141 141  K 4.5 4.6   < > 4.8 4.1 3.9  CL 97* 97*   < > 95* 96* 92*  CO2 28 27   < > 31 34* 38*  GLUCOSE 132* 151*   < > 152* 141* 135*  BUN 25* 28*   < > 54* 54* 48*  CREATININE 1.21 1.28*   < > 1.50* 1.37* 1.25*  CALCIUM 8.2* 8.5*   < > 8.8* 8.6* 8.7*  PROT 7.3 7.1  --   --   --   --  ALBUMIN 3.8 3.6  --   --   --   --   AST 37 37  --   --   --   --   ALT 23 25  --   --   --   --   ALKPHOS 67 57  --   --   --   --   BILITOT 1.5* 1.1  --   --   --   --   GFRNONAA >60 58*   < > 48* 53* 59*  ANIONGAP 9 15   < > 10 11 11    < > = values in this interval not displayed.     Hematology Recent Labs  Lab 09/22/20 0243 09/23/20 0300 09/24/20 0244  WBC 16.2* 14.5* 17.6*  RBC 4.45 4.23 4.44  HGB 12.3* 11.6* 12.2*  HCT 40.1 37.8* 39.6  MCV 90.1 89.4 89.2  MCH 27.6 27.4 27.5  MCHC 30.7 30.7 30.8  RDW 13.9 13.9 14.0  PLT 318 285 305    BNP Recent Labs  Lab 09/17/20 1314  BNP 445.0*     DDimer No results for input(s): DDIMER in the last 168 hours.   Radiology    No results found.  Cardiac Studies   Echocardiogram 09/18/2020: Impressions:  1. Left ventricular ejection fraction by 3D volume is 49 %. The left  ventricle has mildly decreased function. The left ventricle has no  regional wall motion abnormalities. There is mild left  ventricular  hypertrophy. Left ventricular diastolic parameters   are indeterminate.   2. Right ventricular systolic function is normal. The right ventricular  size is normal. There is mildly elevated pulmonary artery systolic  pressure. The estimated right ventricular systolic pressure is 40.5 mmHg.   3. Right atrial size was moderately dilated.   4. The mitral valve is normal in structure. Moderate mitral valve  regurgitation. No evidence of mitral stenosis.   5. The aortic valve is normal in structure. Aortic valve regurgitation is  not visualized. Mild to moderate aortic valve sclerosis/calcification is  present, without any evidence of aortic stenosis.   6. The inferior vena cava is normal in size with greater than 50%  respiratory variability, suggesting right atrial pressure of 3 mmHg.  Patient Profile     75 y.o. male with a history of CAD, ischemic cardiomyopathy/chronic systolic CHF, paroxysmal atrial fibrillation not on anticoagulation at home, iatrogenic hemothorax from bedside thoracentesis in 03/2016 s/p VATS decortication with mechanical [facility]urodesis at [facility], COPD on home O2, hypertension, hyperlipidemia, type 2 diabetes, GERD, and prior tobacco use (quit 4 years ago) who is being seen 09/18/2020 for the evaluation of paroxysmal atrial fibrillation at the request of Dr. 11/18/2020.  Assessment & Plan    1. Paroxysmal Atrial Fibrillation: Patient presented with worsening dyspnea on exertion for the last couple of months as well as weight gain and edema. Found to be in atrial fibrillation with RVR. Electrolytes and TSH wnl. Rates initially in the 170s. He has been on IV amiodarone and lopressor 50mg  q6hr with slowly improving heart rates. Echo showed LVEF of 49% with normal wall motion and moderate MR. CHA2D2-VASc = 6 (CAD, CHF, HTN, DM, age x2) and he was started on Eliquis 5mg  twice daily. TEE/DCCV was deferred due to poor respiratory status and concern he would not tolerate sedation. HR's  continue to be in the 100s-110s over the past 24 hours.  - Continue IV amiodarone for rhythm control - Continue metoprolol tartrate for rate control - Continue eliquis  for stroke ppx  2. Acute on Chronic Combined CHF: p/w worsening DOE. BNP elevated at 445. Chest x-ray showed right lung opacities that may reflect pneumonia but no overt edema. Echo showed LVEF of 49% with normal wall motion. RV normal. He has been diuresing with IV Lasix, increased to 80mg  three times daily 8/16. Documented urinary output of 2.6 L over the past 24 hours and net negative 7.5 L since admission. No significant change in weight since admission. Renal function stable - creatinine down to 1.25 today. Still appears volume overloaded. - Continue current dose of IV Lasix.  - Continue Lopressor as above. Transition to Toprol-XL prior to discharge. - Will wait to start ARB/ARNI for now to allow for more rate control. - Continue TED hose to help with lower extremity edema. - Continue to monitor daily weights, strict I/O's, and renal function.    3. Demand Ischemia in patient with history of CAD s/p PCI x2 in 2015/2016: History of CAD s/p PCIx2 to unknown vessels. High-sensitivity troponin minimally elevated and flat at 35 >>32. Consistent with demand ischemia from atrial fibrillation with RVR and CHF. No chest pain. Echo as above. On DAPT with Aspirin and Plavix at home. Aspirin stopped now that patient is on Eliquis. - Continue beta-blocker as above.  - Continue high-intensity statin. - No ischemic work-up planned at this time.    4. Hypertension: BP generally stable with occasional high/soft readings.  - Continue Lopressor as above.    5. Hyperlipidemia: no recent lipids on file - Continue Lipitor 40mg  daily.   6. Type 2 Diabetes Mellitus: A1C 6.3 this admission - Management per primary team.   7. Acute CKD Stage III: Creatinine 1.21 on admission. Creatinine has been up and down. Stable at 1.25 today. - Continue to  monitor closely with diuresis.   8. COPD Exacerbation - Management per primary team.   9. Obstructive Sleep Apnea  - Continue BiPAP.   10. Leukocytosis: WBC 17.6 today. Afebrile. Procalcitonin negative. Felt to be secondary to steroids.   - Management per primary team.    For questions or updates, please contact [facility] Please consult [email or web address] for contact info under        Signed, 11-06-1986, PA-C  09/24/2020, 7:55 AM    Personally seen and examined. Agree with above.  No changes from cardiology perspective today.  Atrial fibrillation reasonably controlled with IV amiodarone.  Continue with metoprolol as well.  Eliquis for anticoagulation.  No indications at this point for TEE cardioversion.  Beatriz Stallion, MD

## 2020-09-25 ENCOUNTER — Inpatient Hospital Stay (HOSPITAL_COMMUNITY): Payer: Medicare Other

## 2020-09-25 ENCOUNTER — Encounter (HOSPITAL_COMMUNITY): Payer: Self-pay | Admitting: Internal Medicine

## 2020-09-25 DIAGNOSIS — J441 Chronic obstructive pulmonary disease with (acute) exacerbation: Secondary | ICD-10-CM | POA: Diagnosis not present

## 2020-09-25 DIAGNOSIS — I5043 Acute on chronic combined systolic (congestive) and diastolic (congestive) heart failure: Secondary | ICD-10-CM | POA: Diagnosis not present

## 2020-09-25 DIAGNOSIS — N179 Acute kidney failure, unspecified: Secondary | ICD-10-CM | POA: Diagnosis not present

## 2020-09-25 DIAGNOSIS — I4891 Unspecified atrial fibrillation: Secondary | ICD-10-CM | POA: Diagnosis not present

## 2020-09-25 LAB — GLUCOSE, CAPILLARY
Glucose-Capillary: 120 mg/dL — ABNORMAL HIGH (ref 70–99)
Glucose-Capillary: 137 mg/dL — ABNORMAL HIGH (ref 70–99)
Glucose-Capillary: 257 mg/dL — ABNORMAL HIGH (ref 70–99)
Glucose-Capillary: 170 mg/dL — ABNORMAL HIGH (ref 70–99)

## 2020-09-25 MED ORDER — PREDNISONE 20 MG PO TABS
40.0000 mg | ORAL_TABLET | Freq: Every day | ORAL | Status: AC
  Administered 2020-09-26 – 2020-09-27 (×2): 40 mg via ORAL
  Filled 2020-09-25 (×2): qty 2

## 2020-09-25 MED ORDER — MILK AND MOLASSES ENEMA
1.0000 | Freq: Once | RECTAL | Status: AC
  Administered 2020-09-25: 240 mL via RECTAL
  Filled 2020-09-25: qty 240

## 2020-09-25 NOTE — Evaluation (Signed)
Occupational Therapy Evaluation Patient Details Name: Dalton Juarez MRN: [id number] DOB: 1945-10-03 Today's Date: 09/25/2020    History of Present Illness 75 year old male with history of COPD on 2 L/min of oxygen, diabetes mellitus type 2, CHF/ischemic cardiomyopathy, atrial fibrillation presented to ED 09/17/20 with worsening shortness of breath, 10 pound weight gain.  In the ED he was found to be tachycardic, with A. fib with RVR.   Chest x-ray showed bilateral patchy opacity   Clinical Impression   Mr. Dalton Juarez is a 75 year old man who presents with decreased activity tolerance secondary to impaired cardiopulmonary endurance, generalized weakness, impaired balance and new onset of RLE extremity weakness resulting in a sudden decline in functional abilities. Patient min guard for transfer to side of bed but mod x 2 to transfer to recliner due to weak and ataxic RLE. Patient needing increased assistance for ADLs and seated position for safety. Patient is typically independent except for donning socks and doesn't use a device. Patient will benefit from skilled OT services while in hospital to improve deficits and learn compensatory strategies as needed in order to return to PLOF.  Patient is only agreeable to returning home at discharge.    Follow Up Recommendations  Home health OT    Equipment Recommendations  None recommended by OT    Recommendations for Other Services       Precautions / Restrictions Precautions Precautions: Fall Precaution Comments: monitor HR and SATS Restrictions Weight Bearing Restrictions: No      Mobility Bed Mobility Overal bed mobility: Needs Assistance Bed Mobility: Supine to Sit     Supine to sit: Min guard;HOB elevated     General bed mobility comments: patient mobilized without assistance to sitting upright.    Transfers Overall transfer level: Needs assistance Equipment used: Rolling walker (2 wheeled) Transfers: Sit to/from  [employer] Sit to Stand: +2 safety/equipment;+2 physical assistance;Mod assist Stand pivot transfers: Mod assist;+2 physical assistance;+2 safety/equipment       General transfer comment: Noted decreased control of the  right leg, noted ataxia, hyper extension, foot drop on right    Balance Overall balance assessment: Needs assistance Sitting-balance support: Feet supported;Bilateral upper extremity supported Sitting balance-Leahy Scale: Fair     Standing balance support: Bilateral upper extremity supported;During functional activity Standing balance-Leahy Scale: Poor Standing balance comment: relant on UE's                           ADL either performed or assessed with clinical judgement   ADL Overall ADL's : Needs assistance/impaired Eating/Feeding: Set up;Sitting   Grooming: Set up;Sitting   Upper Body Bathing: Set up;Sitting   Lower Body Bathing: Moderate assistance;Sit to/from stand   Upper Body Dressing : Set up;Sitting   Lower Body Dressing: Moderate assistance;Sit to/from stand   Toilet Transfer: Minimal assistance;+2 for safety/equipment;BSC;Stand-pivot;RW   Toileting- Clothing Manipulation and Hygiene: Moderate assistance;Sit to/from stand               Vision Patient Visual Report: No change from baseline       Perception     Praxis      Pertinent Vitals/Pain Pain Assessment: No/denies pain     Hand Dominance Right   Extremity/Trunk Assessment Upper Extremity Assessment Upper Extremity Assessment: Overall WFL for tasks assessed (WFL ROM and 5/5 strength)   Lower Extremity Assessment Lower Extremity Assessment: Defer to PT evaluation RLE Deficits / Details: knee ext 3+, absent Dorsiflex, hip flex4/5,  lacks dorsiflexion to neutral RLE Sensation: WNL LLE Deficits / Details: 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, knee and ankle WFL   Cervical / Trunk Assessment Cervical / Trunk Assessment: Normal    Communication Communication Communication: No difficulties   Cognition Arousal/Alertness: Awake/alert Behavior During Therapy: WFL for tasks assessed/performed Overall Cognitive Status: Within Functional Limits for tasks assessed                                     General Comments       Exercises     Shoulder Instructions      Home Living Family/patient expects to be discharged to:: Private residence Living Arrangements: Spouse/significant other Available Help at Discharge: Family Type of Home: House Home Access: Ramped entrance     Home Layout: One level     Bathroom Shower/Tub: Producer, television/film/video: Handicapped height     Home Equipment: Wheelchair - manual;Grab bars - toilet;Grab bars - tub/shower;Shower seat;Walker - 4 wheels          Prior Functioning/Environment Level of Independence: Needs assistance  Gait / Transfers Assistance Needed: reports not walking with a device ADL's / Homemaking Assistance Needed: needs help with socks            OT Problem List: Decreased activity tolerance;Decreased strength;Impaired balance (sitting and/or standing);Decreased knowledge of use of DME or AE;Obesity;Cardiopulmonary status limiting activity      OT Treatment/Interventions: Self-care/ADL training;Therapeutic exercise;DME and/or AE instruction;Therapeutic activities;Balance training;Patient/family education    OT Goals(Current goals can be found in the care plan section) Acute Rehab OT Goals Patient Stated Goal: going thome at discharge OT Goal Formulation: With patient Time For Goal Achievement: 10/09/20 Potential to Achieve Goals: Good  OT Frequency: Min 2X/week   Barriers to D/C:            Co-evaluation PT/OT/SLP Co-Evaluation/Treatment: Yes Reason for Co-Treatment: For patient/therapist safety;To address functional/ADL transfers PT goals addressed during session: Mobility/safety with mobility OT goals addressed during  session: ADL's and self-care      AM-PAC OT "6 Clicks" Daily Activity     Outcome Measure Help from another person eating meals?: A Little Help from another person taking care of personal grooming?: A Little Help from another person toileting, which includes using toliet, bedpan, or urinal?: A Lot Help from another person bathing (including washing, rinsing, drying)?: A Lot Help from another person to put on and taking off regular upper body clothing?: A Little Help from another person to put on and taking off regular lower body clothing?: A Lot 6 Click Score: 15   End of Session Equipment Utilized During Treatment: Rolling walker;Gait belt Nurse Communication: Mobility status  Activity Tolerance: Patient tolerated treatment well Patient left: in chair;with call bell/phone within reach  OT Visit Diagnosis: Other abnormalities of gait and mobility (R26.89)                Time: 6578-4696 OT Time Calculation (min): 24 min Charges:  OT General Charges $OT Visit: 1 Visit OT Evaluation $OT Eval Low Complexity: 1 Low  Adonte Vanriper, OTR/L Acute Care Rehab Services  Office [phone] Pager: [phone]   Kelli Churn 09/25/2020, 3:04 PM

## 2020-09-25 NOTE — Progress Notes (Signed)
[facility]  PROGRESS NOTE  Dalton Juarez TOI:[id number] DOB: [birth year] DOA: 09/17/2020 PCP: Smith Robert, MD   Brief HPI:   75 year old male with history of COPD on 2 L/min of oxygen via nasal cannula, diabetes mellitus type 2, CHF/ischemic cardiomyopathy, atrial fibrillation presented to ED with 2 to 3 weeks history of progressively worsening shortness of breath, 10 pound weight gain. In the ED he was found to be tachycardic, with A. fib with RVR.  Cardizem drip was started.  Chest x-ray showed bilateral patchy opacity was placed on Levaquin.  Cardiology was consulted    Subjective   Patient seen and examined, complains of constipation.  There was also concern that he may have right-sided facial droop so CT head was obtained, stroke was ruled out.   Assessment/Plan:    Acute on chronic combined systolic and diastolic CHF -Presented with BNP of 445; echo shows EF 49% -Started on Lasix 80 mg IV 3 times daily -Net -9.7 L -Continue to monitor intake and output -cardiology following  Atrial fibrillation with RVR -Heart rate is controlled -Continue Lopressor 50 mg 3 times daily -Continue IV amiodarone -TSH is normal -Started on Eliquis for anticoagulation  Positive troponin -Mild elevation of troponin.  35>> 32 -Likely demand ischemia -No ischemic work-up planned per cardiology -He was on DAPT with aspirin and Plavix at home -Aspirin stopped as patient is on Eliquis  Hypertension -Blood pressure is stable  COPD exacerbation -Continue DuoNeb nebulizer, Solu-Medrol -Add flutter valve every 4 hours -Continue Mucinex, will switch from Solu-Medrol to p.o. prednisone 40 mg daily  Acute kidney injury -In setting of diuresis; creatinine is 1.25 -We will closely monitor patient's creatinine  Diabetes mellitus type 2 -Continue sliding scale insulin with NovoLog -CBG well controlled   Hemorrhoids -Anusol HC suppository twice a day  Constipation -No improvement  with Dulcolax suppository -We will give milk and molasses enema  Urinary retention -Started on Flomax 0.4 mg daily -In and out cath as needed  Leukocytosis -WBC 17,000 ;afebrile, no signs of infection -Likely from steroids  ?  Right-sided facial droop -CT head was negative for stroke -No focal neurological deficit -Appearance of right-sided facial droop could have been due to mask of BiPAP -Resolved  Scheduled medications:    apixaban  5 mg Oral BID   atorvastatin  40 mg Oral Daily   chlorhexidine  15 mL Mouth Rinse BID   Chlorhexidine Gluconate Cloth  6 each Topical Daily   cholecalciferol  2,000 Units Oral Daily   clopidogrel  75 mg Oral Daily   dorzolamide  1 drop Left Eye BID   furosemide  80 mg Intravenous TID   gabapentin  300 mg Oral TID   guaiFENesin  1,200 mg Oral BID   hydrocortisone  25 mg Rectal BID   insulin aspart  0-15 Units Subcutaneous TID WC   insulin aspart  0-5 Units Subcutaneous QHS   ipratropium-albuterol  3 mL Nebulization Q6H   mouth rinse  15 mL Mouth Rinse q12n4p   methylPREDNISolone (SOLU-MEDROL) injection  40 mg Intravenous Daily   metoprolol tartrate  50 mg Oral QID   milk and molasses  1 enema Rectal Once   mometasone-formoterol  2 puff Inhalation BID   multivitamin with minerals  1 tablet Oral Daily   pantoprazole  40 mg Oral Daily   phenylephrine  1 suppository Rectal BID   tamsulosin  0.4 mg Oral Daily         Data Reviewed:   CBG:  Recent  Labs  Lab 09/24/20 1120 09/24/20 1657 09/24/20 2056 09/25/20 0706 09/25/20 1208  GLUCAP 191* 232* 224* 120* 137*    SpO2: 100 % O2 Flow Rate (L/min): 5 L/min FiO2 (%): 30 %    Vitals:   09/25/20 0756 09/25/20 0759 09/25/20 0804 09/25/20 1249  BP:      Pulse:   (!) 112   Resp:   (!) 22   Temp:    (!) 96.7 F (35.9 C)  TempSrc:    Axillary  SpO2: 100% 100% 100%   Weight:      Height:         Intake/Output Summary (Last 24 hours) at 09/25/2020 1352 Last data filed at  09/25/2020 1000 Gross per 24 hour  Intake 413.91 ml  Output 2803 ml  Net -2389.09 ml    08/17 1901 - 08/19 0700 In: 1057.6 [P.O.:480; I.V.:577.6] Out: 4428 [Urine:4428]  Filed Weights   09/23/20 0500 09/24/20 0500 09/25/20 0500  Weight: 104.2 kg 104.2 kg 101.8 kg    CBC:  Recent Labs  Lab 09/20/20 0300 09/21/20 0301 09/22/20 0243 09/23/20 0300 09/24/20 0244  WBC 15.6* 15.5* 16.2* 14.5* 17.6*  HGB 10.8* 11.3* 12.3* 11.6* 12.2*  HCT 34.9* 36.4* 40.1 37.8* 39.6  PLT 317 302 318 285 305  MCV 89.0 89.4 90.1 89.4 89.2  MCH 27.6 27.8 27.6 27.4 27.5  MCHC 30.9 31.0 30.7 30.7 30.8  RDW 14.1 14.1 13.9 13.9 14.0    Complete metabolic panel:  Recent Labs  Lab 09/20/20 0300 09/21/20 0301 09/22/20 0243 09/23/20 0300 09/24/20 0244  NA 136 137 136 141 141  K 4.5 4.4 4.8 4.1 3.9  CL 95* 96* 95* 96* 92*  CO2 29 31 31  34* 38*  GLUCOSE 159* 147* 152* 141* 135*  BUN 47* 50* 54* 54* 48*  CREATININE 1.37* 1.28* 1.50* 1.37* 1.25*  CALCIUM 8.7* 8.8* 8.8* 8.6* 8.7*  MG  --  2.2  --  2.1  --   PROCALCITON  --  <0.10 <0.10 <0.10  --     No results for input(s): LIPASE, AMYLASE in the last 168 hours.  Recent Labs  Lab 09/21/20 0301 09/22/20 0243 09/23/20 0300  PROCALCITON <0.10 <0.10 <0.10    ------------------------------------------------------------------------------------------------------------------ No results for input(s): CHOL, HDL, LDLCALC, TRIG, CHOLHDL, LDLDIRECT in the last 72 hours.  Lab Results  Component Value Date   HGBA1C 6.3 (H) 09/17/2020   ------------------------------------------------------------------------------------------------------------------ No results for input(s): TSH, T4TOTAL, T3FREE, THYROIDAB in the last 72 hours.  Invalid input(s): FREET3 ------------------------------------------------------------------------------------------------------------------ No results for input(s): VITAMINB12, FOLATE, FERRITIN, TIBC, IRON, RETICCTPCT in  the last 72 hours.  Coagulation profile No results for input(s): INR, PROTIME in the last 168 hours.  No results for input(s): DDIMER in the last 72 hours.  Cardiac Enzymes No results for input(s): CKTOTAL, CKMB, CKMBINDEX, TROPONINI in the last 168 hours.  ------------------------------------------------------------------------------------------------------------------    Component Value Date/Time   BNP 445.0 (H) 09/17/2020 1314     Antibiotics: Anti-infectives (From admission, onward)    Start     Dose/Rate Route Frequency Ordered Stop   09/17/20 1415  levofloxacin (LEVAQUIN) IVPB 500 mg        500 mg 100 mL/hr over 60 Minutes Intravenous  Once 09/17/20 1400 09/17/20 1818        Radiology Reports  CT HEAD CODE STROKE WO CONTRAST  Result Date: 09/25/2020 CLINICAL DATA:  Code stroke. Acute onset left pupil change, right facial droop EXAM: CT HEAD WITHOUT CONTRAST TECHNIQUE: Contiguous axial images were  obtained from the base of the skull through the vertex without intravenous contrast. COMPARISON:  None. FINDINGS: Brain: There is no evidence of acute intracranial hemorrhage, extra-axial fluid collection, or infarct. There is mild parenchymal volume loss with commensurate enlargement of the ventricular system. Foci of hypodensity in the subcortical and periventricular white matter likely reflect mild chronic white matter microangiopathy. There is no mass lesion. There is no midline shift. Vascular: There is calcification of the bilateral cavernous ICAs. Skull: Normal. Negative for fracture or focal lesion. Sinuses/Orbits: The imaged paranasal sinuses are clear. The patient is status post right lens implant. The globes and orbits are otherwise unremarkable. Other: There is a remote right nasal bone fracture. ASPECTS [facility] Stroke Program Early CT Score) - Ganglionic level infarction (caudate, lentiform nuclei, internal capsule, insula, M1-M3 cortex): 3 - Supraganglionic infarction  (M4-M6 cortex): 7 Total score (0-10 with 10 being normal): 10 IMPRESSION: 1. No acute intracranial pathology 2. ASPECTS is 10 3. Mild parenchymal volume loss and chronic white matter microangiopathy. These results were called by telephone at the time of interpretation on 09/25/2020 at 10:08 am to provider [facility] , who verbally acknowledged these results. Electronically Signed   By: Lesia Hausen M.D.   On: 09/25/2020 10:09      DVT prophylaxis: Apixaban  Code Status: Full code  Family Communication: discussed with wife at bedside   Consultants: Cardiology  Procedures:     Objective    Physical Examination:  General-appears in no acute distress Heart-S1-S2, regular, no murmur auscultated Lungs-clear to auscultation bilaterally, no wheezing or crackles auscultated Abdomen-soft, nontender, no organomegaly Extremities-trace edema in the lower extremities Neuro-alert, oriented x3, no focal deficit noted  Status is: Inpatient  Dispo: The patient is from: Home              Anticipated d/c is to: Home              Anticipated d/c date is: 09/27/2020              Patient currently currently not stable for discharge  Barrier to discharge-ongoing treatment for A. fib with RVR  COVID-19 Labs  No results for input(s): DDIMER, FERRITIN, LDH, CRP in the last 72 hours.  Lab Results  Component Value Date   SARSCOV2NAA NEGATIVE 09/17/2020    Microbiology  Recent Results (from the past 240 hour(s))  Resp Panel by RT-PCR (Flu A&B, Covid) Nasopharyngeal Swab     Status: None   Collection Time: 09/17/20  6:00 PM   Specimen: Nasopharyngeal Swab; Nasopharyngeal(NP) swabs in vial transport medium  Result Value Ref Range Status   SARS Coronavirus 2 by RT PCR NEGATIVE NEGATIVE Final    Comment: (NOTE) SARS-CoV-2 target nucleic acids are NOT DETECTED.  The SARS-CoV-2 RNA is generally detectable in upper respiratory specimens during the acute phase of infection. The  lowest concentration of SARS-CoV-2 viral copies this assay can detect is 138 copies/mL. A negative result does not preclude SARS-Cov-2 infection and should not be used as the sole basis for treatment or other patient management decisions. A negative result may occur with  improper specimen collection/handling, submission of specimen other than nasopharyngeal swab, presence of viral mutation(s) within the areas targeted by this assay, and inadequate number of viral copies(<138 copies/mL). A negative result must be combined with clinical observations, patient history, and epidemiological information. The expected result is Negative.  Fact Sheet for Patients:  BloggerCourse.com  Fact Sheet for Healthcare Providers:  SeriousBroker.it  This test is no  t yet approved or cleared by the [place] and  has been authorized for detection and/or diagnosis of SARS-CoV-2 by FDA under an Emergency Use Authorization (EUA). This EUA will remain  in effect (meaning this test can be used) for the duration of the COVID-19 declaration under Section 564(b)(1) of the Act, 21 U.S.C.section 360bbb-3(b)(1), unless the authorization is terminated  or revoked sooner.       Influenza A by PCR NEGATIVE NEGATIVE Final   Influenza B by PCR NEGATIVE NEGATIVE Final    Comment: (NOTE) The Xpert Xpress SARS-CoV-2/FLU/RSV plus assay is intended as an aid in the diagnosis of influenza from Nasopharyngeal swab specimens and should not be used as a sole basis for treatment. Nasal washings and aspirates are unacceptable for Xpert Xpress SARS-CoV-2/FLU/RSV testing.  Fact Sheet for Patients: BloggerCourse.com  Fact Sheet for Healthcare Providers: SeriousBroker.it  This test is not yet approved or cleared by the [place] FDA and has been authorized for detection and/or diagnosis of SARS-CoV-2 by FDA under  an Emergency Use Authorization (EUA). This EUA will remain in effect (meaning this test can be used) for the duration of the COVID-19 declaration under Section 564(b)(1) of the Act, 21 U.S.C. section 360bbb-3(b)(1), unless the authorization is terminated or revoked.  Performed at [facility], [address] [place], [place] [address]   MRSA Next Gen by PCR, Nasal     Status: Abnormal   Collection Time: 09/18/20 12:17 AM   Specimen: Nasal Mucosa; Nasal Swab  Result Value Ref Range Status   MRSA by PCR Next Gen DETECTED (A) NOT DETECTED Final    Comment: RESULT CALLED TO, READ BACK BY AND VERIFIED WITH: DANIELLE, RN @ [phone] ON 09/18/20 C VARNER (NOTE) The GeneXpert MRSA Assay (FDA approved for NASAL specimens only), is one component of a comprehensive MRSA colonization surveillance program. It is not intended to diagnose MRSA infection nor to guide or monitor treatment for MRSA infections. Test performance is not FDA approved in patients less than 55 years old. Performed at [facility], [address] [place], [place] [address]          Meredeth Ide   [facility] If 7PM-7AM, please contact night-coverage at [email or web address], Office  [phone]   09/25/2020, 1:52 PM  LOS: 8 days

## 2020-09-25 NOTE — Evaluation (Signed)
Physical Therapy Evaluation Patient Details Name: Dalton Juarez MRN: [id number] DOB: 15-Apr-1945 Today's Date: 09/25/2020   History of Present Illness  75 year old male with history of COPD on 2 L/min of oxygen, diabetes mellitus type 2, CHF/ischemic cardiomyopathy, atrial fibrillation presented to ED 09/17/20 with worsening shortness of breath, 10 pound weight gain.  In the ED he was found to be tachycardic, with A. fib with RVR.   Chest x-ray showed bilateral patchy opacity  Clinical Impression  The patient mobilized well to sitting. Patient reporting that his right leg feels weak and "Like it's pulling in the back of my leg.". Patient with  noted decreased strength and mildly ataxic on the right leg when standing. Patient does report a right foot drop present prior to admission.Noted slight plantarflexed contracture on the right. Right knee  noted to hyperextend during WB and lacks control to take a step.  Patient's only caregiver is his wife and neighbors. Patient may benefit from SNF but patient  adamantly states going home. Pt admitted with above diagnosis.  Pt currently with functional limitations due to the deficits listed below (see PT Problem List). Pt will benefit from skilled PT to increase their independence and safety with mobility to allow discharge to the venue listed below.    HR 118, SPO2 onRA 92, On 4 L 95%     Follow Up Recommendations SNF    Equipment Recommendations  None recommended by PT    Recommendations for Other Services       Precautions / Restrictions Precautions Precautions: Fall Precaution Comments: monitor HR and SATS      Mobility  Bed Mobility Overal bed mobility: Needs Assistance Bed Mobility: Supine to Sit     Supine to sit: Min guard;HOB elevated     General bed mobility comments: patient mobilized without assistance to sitting upright.    Transfers Overall transfer level: Needs assistance Equipment used: Rolling walker (2  wheeled) Transfers: Sit to/from [employer] Sit to Stand: +2 safety/equipment;+2 physical assistance;Mod assist Stand pivot transfers: Mod assist;+2 physical assistance;+2 safety/equipment       General transfer comment: Noted decreased control of the  right leg, noted ataxia, hyper extension, foot drop on right  Ambulation/Gait                Stairs            Wheelchair Mobility    Modified Rankin (Stroke Patients Only)       Balance Overall balance assessment: Needs assistance Sitting-balance support: Feet supported;Bilateral upper extremity supported Sitting balance-Leahy Scale: Fair     Standing balance support: Bilateral upper extremity supported;During functional activity Standing balance-Leahy Scale: Poor Standing balance comment: relant on UE's                             Pertinent Vitals/Pain Pain Assessment: No/denies pain    Home Living Family/patient expects to be discharged to:: Private residence Living Arrangements: Spouse/significant other Available Help at Discharge: Family Type of Home: House Home Access: Ramped entrance     Home Layout: One level Home Equipment: Wheelchair - manual;Grab bars - toilet;Grab bars - tub/shower;Shower seat;Walker - 4 wheels      Prior Function Level of Independence: Needs assistance   Gait / Transfers Assistance Needed: reports not walking with a device  ADL's / Homemaking Assistance Needed: needs help with socks        Hand Dominance   Dominant Hand: Right  Extremity/Trunk Assessment        Lower Extremity Assessment Lower Extremity Assessment: RLE deficits/detail;LLE deficits/detail RLE Deficits / Details: knee ext 3+, absent Dorsiflex, hip flex4/5, lacks dorsiflexion to neutral RLE Sensation: WNL LLE Deficits / Details: 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, knee and ankle WFL    Cervical / Trunk Assessment Cervical / Trunk Assessment: Normal   Communication   Communication: No difficulties  Cognition Arousal/Alertness: Awake/alert Behavior During Therapy: WFL for tasks assessed/performed Overall Cognitive Status: Within Functional Limits for tasks assessed                                        General Comments      Exercises     Assessment/Plan    PT Assessment Patient needs continued PT services  PT Problem List Decreased strength;Decreased balance;Decreased knowledge of precautions;Decreased mobility;Decreased knowledge of use of DME;Cardiopulmonary status limiting activity;Decreased activity tolerance;Decreased safety awareness       PT Treatment Interventions DME instruction;Therapeutic activities;Gait training;Therapeutic exercise;Patient/family education;Functional mobility training    PT Goals (Current goals can be found in the Care Plan section)  Acute Rehab PT Goals Patient Stated Goal: Iing home PT Goal Formulation: With patient Time For Goal Achievement: 10/09/20 Potential to Achieve Goals: Good    Frequency Min 2X/week   Barriers to discharge        Co-evaluation PT/OT/SLP Co-Evaluation/Treatment: Yes Reason for Co-Treatment: To address functional/ADL transfers;For patient/therapist safety PT goals addressed during session: Mobility/safety with mobility OT goals addressed during session: ADL's and self-care       AM-PAC PT "6 Clicks" Mobility  Outcome Measure Help needed turning from your back to your side while in a flat bed without using bedrails?: A Little Help needed moving from lying on your back to sitting on the side of a flat bed without using bedrails?: A Little Help needed moving to and from a bed to a chair (including a wheelchair)?: A Lot Help needed standing up from a chair using your arms (e.g., wheelchair or bedside chair)?: A Lot Help needed to walk in hospital room?: A Lot Help needed climbing 3-5 steps with a railing? : Total 6 Click Score: 13    End of  Session Equipment Utilized During Treatment: Gait belt Activity Tolerance: Patient tolerated treatment well Patient left: in chair;with call bell/phone within reach Nurse Communication: Mobility status PT Visit Diagnosis: Difficulty in walking, not elsewhere classified (R26.2);Muscle weakness (generalized) (M62.81)    Time: 1120-1150 PT Time Calculation (min) (ACUTE ONLY): 30 min   Charges:   PT Evaluation $PT Eval Low Complexity: 1 Low          Blanchard Kelch PT Acute Rehabilitation Services Pager [phone] Office [phone]   Rada Hay 09/25/2020, 1:26 PM

## 2020-09-25 NOTE — Progress Notes (Signed)
Patient had a condom cath, voided total but complaining that he can't urinate and feel bladder is full. Bladder scan= 530 ml, did straight cath, 600 ml volume out.

## 2020-09-25 NOTE — Progress Notes (Signed)
Progress Note  Patient Name: Dalton Juarez Date of Encounter: 09/25/2020  [facility] Cardiologist: Thurmon Fair, MD   Subjective   No CP  Inpatient Medications    Scheduled Meds:  apixaban  5 mg Oral BID   atorvastatin  40 mg Oral Daily   chlorhexidine  15 mL Mouth Rinse BID   Chlorhexidine Gluconate Cloth  6 each Topical Daily   cholecalciferol  2,000 Units Oral Daily   clopidogrel  75 mg Oral Daily   dorzolamide  1 drop Left Eye BID   furosemide  80 mg Intravenous TID   gabapentin  300 mg Oral TID   guaiFENesin  1,200 mg Oral BID   hydrocortisone  25 mg Rectal BID   insulin aspart  0-15 Units Subcutaneous TID WC   insulin aspart  0-5 Units Subcutaneous QHS   ipratropium-albuterol  3 mL Nebulization Q6H   mouth rinse  15 mL Mouth Rinse q12n4p   methylPREDNISolone (SOLU-MEDROL) injection  40 mg Intravenous Daily   metoprolol tartrate  50 mg Oral QID   milk and molasses  1 enema Rectal Once   mometasone-formoterol  2 puff Inhalation BID   multivitamin with minerals  1 tablet Oral Daily   pantoprazole  40 mg Oral Daily   phenylephrine  1 suppository Rectal BID   tamsulosin  0.4 mg Oral Daily   Continuous Infusions:  sodium chloride Stopped (09/20/20 2035)   amiodarone 30 mg/hr (09/25/20 0600)   PRN Meds: sodium chloride, acetaminophen **OR** acetaminophen, guaiFENesin-dextromethorphan, ipratropium-albuterol, metoprolol tartrate, nitroGLYCERIN, witch hazel-glycerin   Vital Signs    Vitals:   09/25/20 0700 09/25/20 0756 09/25/20 0759 09/25/20 0804  BP:      Pulse:    (!) 112  Resp:    (!) 22  Temp: (!) 96.2 F (35.7 C)     TempSrc: Axillary     SpO2:  100% 100% 100%  Weight:      Height:        Intake/Output Summary (Last 24 hours) at 09/25/2020 0850 Last data filed at 09/25/2020 0600 Gross per 24 hour  Intake 842.17 ml  Output 2828 ml  Net -1985.83 ml   Last 3 Weights 09/25/2020 09/24/2020 09/23/2020  Weight (lbs) 224 lb 6.9 oz 229 lb 11.5 oz 229  lb 11.5 oz  Weight (kg) 101.8 kg 104.2 kg 104.2 kg      Telemetry    AFIB mostly rate controlled - Personally Reviewed  ECG    No new tracing - Personally Reviewed  Physical Exam   GEN: No acute distress.   Neck: No JVD Cardiac: IRREG, no murmurs, rubs, or gallops.  Respiratory: Clear to auscultation bilaterally. GI: Soft, nontender, non-distended  MS: 1+ BLE edema; No deformity. Neuro:  Nonfocal  Psych: Normal affect   Labs    High Sensitivity Troponin:   Recent Labs  Lab 09/17/20 1314 09/17/20 1522  TROPONINIHS 35* 31*      Chemistry Recent Labs  Lab 09/22/20 0243 09/23/20 0300 09/24/20 0244  NA 136 141 141  K 4.8 4.1 3.9  CL 95* 96* 92*  CO2 31 34* 38*  GLUCOSE 152* 141* 135*  BUN 54* 54* 48*  CREATININE 1.50* 1.37* 1.25*  CALCIUM 8.8* 8.6* 8.7*  GFRNONAA 48* 53* 59*  ANIONGAP 10 11 11      Hematology Recent Labs  Lab 09/22/20 0243 09/23/20 0300 09/24/20 0244  WBC 16.2* 14.5* 17.6*  RBC 4.45 4.23 4.44  HGB 12.3* 11.6* 12.2*  HCT 40.1 37.8* 39.6  MCV 90.1 89.4 89.2  MCH 27.6 27.4 27.5  MCHC 30.7 30.7 30.8  RDW 13.9 13.9 14.0  PLT 318 285 305    BNPNo results for input(s): BNP, PROBNP in the last 168 hours.   DDimer No results for input(s): DDIMER in the last 168 hours.   Radiology    No results found.  Cardiac Studies   Echocardiogram 09/18/2020: Impressions:  1. Left ventricular ejection fraction by 3D volume is 49 %. The left  ventricle has mildly decreased function. The left ventricle has no  regional wall motion abnormalities. There is mild left ventricular  hypertrophy. Left ventricular diastolic parameters   are indeterminate.   2. Right ventricular systolic function is normal. The right ventricular  size is normal. There is mildly elevated pulmonary artery systolic  pressure. The estimated right ventricular systolic pressure is 40.5 mmHg.   3. Right atrial size was moderately dilated.   4. The mitral valve is normal in  structure. Moderate mitral valve  regurgitation. No evidence of mitral stenosis.   5. The aortic valve is normal in structure. Aortic valve regurgitation is  not visualized. Mild to moderate aortic valve sclerosis/calcification is  present, without any evidence of aortic stenosis.   6. The inferior vena cava is normal in size with greater than 50%  respiratory variability, suggesting right atrial pressure of 3 mmHg.   Patient Profile     75 y.o. male with a history of CAD, ischemic cardiomyopathy/chronic systolic CHF, paroxysmal atrial fibrillation not on anticoagulation at home, iatrogenic hemothorax from bedside thoracentesis in 03/2016 s/p VATS decortication with mechanical [facility]urodesis at [facility], COPD on home O2, hypertension, hyperlipidemia, type 2 diabetes, GERD, and prior tobacco use (quit 4 years ago) who is being followed by cardiology for the evaluation of paroxysmal atrial fibrillation   Assessment & Plan    1. Paroxysmal Atrial Fibrillation: Patient presented with worsening dyspnea on exertion for the last couple of months as well as weight gain and edema. Found to be in atrial fibrillation with RVR. Electrolytes and TSH wnl. Rates initially in the 170s. He has been on IV amiodarone and lopressor 50mg  q6hr with slowly improving heart rates. Echo showed LVEF of 49% with normal wall motion and moderate MR. CHA2D2-VASc = 6 (CAD, CHF, HTN, DM, age x2) and he was started on Eliquis 5mg  twice daily. TEE/DCCV was deferred due to poor respiratory status and concern he would not tolerate sedation. HR's continue to be in the 80s-110s over the past 24 hours.  - Continue IV amiodarone for rhythm control - Continue metoprolol tartrate for rate control - Continue eliquis for stroke ppx - No changes today   2. Acute on Chronic Combined CHF: p/w worsening DOE. BNP elevated at 445. Chest x-ray showed right lung opacities that may reflect pneumonia but no overt edema. Echo showed LVEF of 49% with normal  wall motion. RV normal. He has been diuresing with IV Lasix, increased to 80mg  three times daily 8/16. Documented urinary output of -2.8L over the past 24 hours and net negative 9.5L since admission. Weight is down to 224lbs today from 229lbs. Renal function stable - creatinine down to 1.25 yesterday. Had some trouble voiding overnight and required straight cath with UOP this morning. - Continue current dose of IV Lasix.  - Continue Lopressor as above. Transition to Toprol-XL prior to discharge. - Will wait to start ARB/ARNI for now to allow for more rate control and diuresis. - Continue TED hose to help with lower extremity  edema. - Continue to monitor daily weights, strict I/O's, and renal function.  -No changes today   3. Demand Ischemia in patient with history of CAD s/p PCI x2 in 2015/2016: History of CAD s/p PCIx2 to unknown vessels. High-sensitivity troponin minimally elevated and flat at 35 >>32. Consistent with demand ischemia from atrial fibrillation with RVR and CHF. No chest pain. Echo as above. On DAPT with Aspirin and Plavix at home. Aspirin stopped now that patient is on Eliquis. - Continue plavix - Continue beta-blocker as above.  - Continue high-intensity statin. - No ischemic work-up planned at this time.    4. Hypertension: BP generally stable with occasional high/soft readings.  - Continue Lopressor as above.    5. Hyperlipidemia: no recent lipids on file - Continue Lipitor 40mg  daily.   6. Type 2 Diabetes Mellitus: A1C 6.3 this admission - Management per primary team.   7. Acute CKD Stage III: Creatinine 1.21 on admission. Creatinine has been up and down. Stable at 1.25 yesterday. - Continue to monitor closely with diuresis.   8. COPD Exacerbation: on Steroids and nebulizers - Management per primary team.   9. Obstructive Sleep Apnea  - Continue BiPAP.   10. Leukocytosis: WBC 17.6 yesterday. Afebrile. Procalcitonin negative. Felt to be secondary to steroids.    - Management per primary team.       For questions or updates, please contact [facility] Please consult [email or web address] for contact info under        Signed, , PA-C  09/25/2020, 8:50 AM    Personally seen and examined. Agree with above.  No significant changes for today.  Atrial fibrillation at times has been with rapid ventricular response but for the most part under reasonable control.  Continue with IV diuresis.  Creatinine seems to be improving.  Still has evidence of volume overload.  09/27/2020, MD

## 2020-09-25 NOTE — TOC Progression Note (Signed)
Transition of Care [facility]) - Progression Note    Patient Details  Name: Dalton Juarez MRN: [id number] Date of Birth: 1945-09-07  Transition of Care [facility]) CM/SW Contact  Golda Acre, RN Phone Number: 09/25/2020, 8:10 AM  Clinical Narrative:     Acute on chronic combined systolic and diastolic CHF -Presented with BNP of 445; echo shows EF 49% -Started on Lasix 80 mg IV 3 times daily -Net - 7.8 L -Continue to monitor intake and output -cardiology following   Atrial fibrillation with RVR -Heart rate is controlled -Continue Lopressor 50 mg 3 times daily -Continue IV amiodarone -TSH is normal -Started on Eliquis for anticoagulation   Positive troponin -Mild elevation of troponin.  35>> 32 -Likely demand ischemia -No ischemic work-up planned per cardiology -He was on DAPT with aspirin and Plavix at home -Aspirin stopped as patient is on Eliquis   Hypertension -Blood pressure is stable   COPD exacerbation -Continue DuoNeb nebulizer, Solu-Medrol -Add flutter valve every 4 hours -Continue Mucinex, Solu-Medrol 40 mg IV daily   Acute kidney injury -In setting of diuresis; creatinine is 1.37 -We will closely monitor patient's creatinine   Diabetes mellitus type 2 -Continue sliding scale insulin with NovoLog -CBG well controlled at   Hemorrhoids -We will start Anusol HC suppository twice a day   Urinary retention -Start Flomax 0.4 mg daily -In and out cath as needed   Leukocytosis -WBC 17,000 ;afebrile, no signs of infection -Likely from steroids  TOC PLAN OF CARE:  Home with heart failure program through [facility]. Progression: see above.       Expected Discharge Plan and Services                                                 Social Determinants of Health (SDOH) Interventions    Readmission Risk Interventions No flowsheet data found.

## 2020-09-25 NOTE — TOC Progression Note (Signed)
Transition of Care [facility]) - Progression Note    Patient Details  Name: Dalton Juarez MRN: [id number] Date of Birth: [birth year]  Transition of Care [facility]) CM/SW Contact  Golda Acre, RN Phone Number: 09/25/2020, 2:28 PM  Clinical Narrative:    [facility] number is [phone] A Ssn: [id number] Fl2 faxed out to area snf's.        Expected Discharge Plan and Services                                                 Social Determinants of Health (SDOH) Interventions    Readmission Risk Interventions No flowsheet data found.

## 2020-09-25 NOTE — NC FL2 (Signed)
[place] MEDICAID FL2 LEVEL OF CARE SCREENING TOOL     IDENTIFICATION  Patient Name: Dalton Juarez Birthdate: 1945/06/24 Sex: male Admission Date (Current Location): 09/17/2020  [facility] and [place] Number:  Producer, television/film/video and Address:  [facility],  [address] [place] [address]      Provider Number: [id number]  Attending Physician Name and Address:  Meredeth Ide, MD  Relative Name and Phone Number:       Current Level of Care: Hospital Recommended Level of Care: Skilled Nursing Facility Prior Approval Number:    Date Approved/Denied:   PASRR Number: [id number]  Discharge Plan: SNF    Current Diagnoses: Patient Active Problem List   Diagnosis Date Noted   CHF exacerbation (HCC) 09/17/2020   CAD (coronary artery disease) 09/17/2020   Atrial fibrillation with RVR (HCC) 09/17/2020   COPD with acute exacerbation (HCC) 09/17/2020    Orientation RESPIRATION BLADDER Height & Weight     Self, Time, Situation, Place  Normal Continent Weight: 101.8 kg Height:  5\' 7"  (170.2 cm)  BEHAVIORAL SYMPTOMS/MOOD NEUROLOGICAL BOWEL NUTRITION STATUS      Continent Diet (regular)  AMBULATORY STATUS COMMUNICATION OF NEEDS Skin   Extensive Assist Verbally Normal                       Personal Care Assistance Level of Assistance  Bathing, Feeding, Dressing Bathing Assistance: Limited assistance Feeding assistance: Limited assistance Dressing Assistance: Limited assistance     Functional Limitations Info  Sight, Hearing, Speech Sight Info: Adequate Hearing Info: Adequate Speech Info: Adequate    SPECIAL CARE FACTORS FREQUENCY  PT (By licensed PT), OT (By licensed OT)     PT Frequency: 5 x weekly OT Frequency: 5 x weekly            Contractures Contractures Info: Not present    Additional Factors Info  Code Status Code Status Info: full             Current Medications (09/25/2020):  This is the current hospital active medication  list Current Facility-Administered Medications  Medication Dose Route Frequency Provider Last Rate Last Admin   0.9 %  sodium chloride infusion   Intravenous PRN 09/27/2020, MD   Stopped at 09/20/20 2035   acetaminophen (TYLENOL) tablet 650 mg  650 mg Oral Q6H PRN 2036, MD   650 mg at 09/20/20 2233   Or   acetaminophen (TYLENOL) suppository 650 mg  650 mg Rectal Q6H PRN 2234, MD       amiodarone (NEXTERONE PREMIX) 360-4.14 MG/200ML-% (1.8 mg/mL) IV infusion  30 mg/hr Intravenous Continuous 06-02-1979, MD 16.67 mL/hr at 09/25/20 1409 30 mg/hr at 09/25/20 1409   apixaban (ELIQUIS) tablet 5 mg  5 mg Oral BID 09/27/20, MD   5 mg at 09/25/20 0908   atorvastatin (LIPITOR) tablet 40 mg  40 mg Oral Daily 09/27/20, MD   40 mg at 09/25/20 0908   chlorhexidine (PERIDEX) 0.12 % solution 15 mL  15 mL Mouth Rinse BID 09/27/20, MD   15 mL at 09/25/20 0908   Chlorhexidine Gluconate Cloth 2 % PADS 6 each  6 each Topical Daily 09/27/20, MD   6 each at 09/25/20 1410   cholecalciferol (VITAMIN D3) tablet 2,000 Units  2,000 Units Oral Daily 09/27/20, MD   2,000 Units at 09/25/20 0908   clopidogrel (PLAVIX) tablet 75 mg  75 mg Oral Daily Jerald Kief, MD   75 mg at 09/25/20 0908   dorzolamide (TRUSOPT) 2 % ophthalmic solution 1 drop  1 drop Left Eye BID Jerald Kief, MD   1 drop at 09/25/20 0350   furosemide (LASIX) injection 80 mg  80 mg Intravenous TID Jake Bathe, MD   80 mg at 09/25/20 0908   gabapentin (NEURONTIN) capsule 300 mg  300 mg Oral TID Jerald Kief, MD   300 mg at 09/25/20 0908   guaiFENesin (MUCINEX) 12 hr tablet 1,200 mg  1,200 mg Oral BID Meredeth Ide, MD   1,200 mg at 09/25/20 0908   guaiFENesin-dextromethorphan (ROBITUSSIN DM) 100-10 MG/5ML syrup 5 mL  5 mL Oral Q4H PRN Luiz Iron, NP   5 mL at 09/22/20 2052   hydrocortisone (ANUSOL-HC) suppository 25 mg  25 mg Rectal BID Meredeth Ide, MD   25 mg at 09/25/20 0911    insulin aspart (novoLOG) injection 0-15 Units  0-15 Units Subcutaneous TID WC Jerald Kief, MD   5 Units at 09/24/20 1729   insulin aspart (novoLOG) injection 0-5 Units  0-5 Units Subcutaneous QHS Jerald Kief, MD   2 Units at 09/24/20 2101   ipratropium-albuterol (DUONEB) 0.5-2.5 (3) MG/3ML nebulizer solution 3 mL  3 mL Nebulization Q6H Jerald Kief, MD   3 mL at 09/25/20 0756   ipratropium-albuterol (DUONEB) 0.5-2.5 (3) MG/3ML nebulizer solution 3 mL  3 mL Nebulization Q2H PRN Jerald Kief, MD   3 mL at 09/21/20 1204   MEDLINE mouth rinse  15 mL Mouth Rinse q12n4p Jerald Kief, MD   15 mL at 09/24/20 1610   metoprolol tartrate (LOPRESSOR) injection 5 mg  5 mg Intravenous Q4H PRN [place] Ouch, Brittany M, PA-C   5 mg at 09/21/20 0847   metoprolol tartrate (LOPRESSOR) tablet 50 mg  50 mg Oral QID Kc, Dayna Barker, MD   50 mg at 09/25/20 1409   milk and molasses enema  1 enema Rectal Once Meredeth Ide, MD       mometasone-formoterol [facility]) 200-5 MCG/ACT inhaler 2 puff  2 puff Inhalation BID Jerald Kief, MD   2 puff at 09/25/20 0756   multivitamin with minerals tablet 1 tablet  1 tablet Oral Daily Jerald Kief, MD   1 tablet at 09/25/20 0908   nitroGLYCERIN (NITROSTAT) SL tablet 0.4 mg  0.4 mg Sublingual Q5 min PRN Jerald Kief, MD       pantoprazole (PROTONIX) EC tablet 40 mg  40 mg Oral Daily Jerald Kief, MD   40 mg at 09/25/20 0908   phenylephrine ((USE for PREPARATION-H)) 0.25 % suppository 1 suppository  1 suppository Rectal BID Audrea Muscat T, NP   1 suppository at 09/25/20 0911   [START ON 09/26/2020] predniSONE (DELTASONE) tablet 40 mg  40 mg Oral Q breakfast Meredeth Ide, MD       tamsulosin (FLOMAX) capsule 0.4 mg  0.4 mg Oral Daily Meredeth Ide, MD   0.4 mg at 09/25/20 0938   witch hazel-glycerin (TUCKS) pad   Topical PRN Cipriano Bunker, MD   Given at 09/24/20 [phone]     Discharge Medications: Please see discharge summary for a list of discharge  medications.  Relevant Imaging Results:  Relevant Lab Results:   Additional Information ssn:[id number]  Golda Acre, RN

## 2020-09-26 DIAGNOSIS — J441 Chronic obstructive pulmonary disease with (acute) exacerbation: Secondary | ICD-10-CM | POA: Diagnosis not present

## 2020-09-26 DIAGNOSIS — I5043 Acute on chronic combined systolic (congestive) and diastolic (congestive) heart failure: Secondary | ICD-10-CM | POA: Diagnosis not present

## 2020-09-26 DIAGNOSIS — I4891 Unspecified atrial fibrillation: Secondary | ICD-10-CM | POA: Diagnosis not present

## 2020-09-26 LAB — GLUCOSE, CAPILLARY
Glucose-Capillary: 120 mg/dL — ABNORMAL HIGH (ref 70–99)
Glucose-Capillary: 166 mg/dL — ABNORMAL HIGH (ref 70–99)
Glucose-Capillary: 170 mg/dL — ABNORMAL HIGH (ref 70–99)
Glucose-Capillary: 161 mg/dL — ABNORMAL HIGH (ref 70–99)

## 2020-09-26 LAB — CBC
HCT: 38.6 % — ABNORMAL LOW (ref 39.0–52.0)
Hemoglobin: 11.8 g/dL — ABNORMAL LOW (ref 13.0–17.0)
MCH: 27.2 pg (ref 26.0–34.0)
MCHC: 30.6 g/dL (ref 30.0–36.0)
MCV: 88.9 fL (ref 80.0–100.0)
Platelets: 334 10*3/uL (ref 150–400)
RBC: 4.34 MIL/uL (ref 4.22–5.81)
RDW: 13.8 % (ref 11.5–15.5)
WBC: 17.7 10*3/uL — ABNORMAL HIGH (ref 4.0–10.5)
nRBC: 0 % (ref 0.0–0.2)

## 2020-09-26 LAB — BASIC METABOLIC PANEL
Anion gap: 13 (ref 5–15)
BUN: 44 mg/dL — ABNORMAL HIGH (ref 8–23)
CO2: 40 mmol/L — ABNORMAL HIGH (ref 22–32)
Calcium: 8.8 mg/dL — ABNORMAL LOW (ref 8.9–10.3)
Chloride: 89 mmol/L — ABNORMAL LOW (ref 98–111)
Creatinine, Ser: 1.17 mg/dL (ref 0.61–1.24)
GFR, Estimated: >60 mL/min (ref >60)
Glucose, Bld: 141 mg/dL — ABNORMAL HIGH (ref 70–99)
Potassium: 3.5 mmol/L (ref 3.5–5.1)
Sodium: 142 mmol/L (ref 135–145)

## 2020-09-26 MED ORDER — POTASSIUM CHLORIDE CRYS ER 20 MEQ PO TBCR
40.0000 meq | EXTENDED_RELEASE_TABLET | Freq: Once | ORAL | Status: AC
  Administered 2020-09-26: 40 meq via ORAL
  Filled 2020-09-26: qty 2

## 2020-09-26 MED ORDER — INSULIN ASPART 100 UNIT/ML IJ SOLN: 5.0000 [IU] | Freq: Once | INTRAMUSCULAR | Status: DC

## 2020-09-26 MED ORDER — REVEFENACIN 175 MCG/3ML IN SOLN
175.0000 ug | Freq: Every day | RESPIRATORY_TRACT | Status: DC
  Filled 2020-09-26: qty 3

## 2020-09-26 MED ORDER — POLYETHYLENE GLYCOL 3350 17 G PO PACK
17.0000 g | PACK | Freq: Two times a day (BID) | ORAL | Status: DC
  Administered 2020-09-26 – 2020-10-01 (×5): 17 g via ORAL
  Filled 2020-09-26 (×11): qty 1

## 2020-09-26 MED ORDER — MOMETASONE FURO-FORMOTEROL FUM 200-5 MCG/ACT IN AERO
2.0000 | INHALATION_SPRAY | Freq: Two times a day (BID) | RESPIRATORY_TRACT | Status: DC
  Administered 2020-09-26 – 2020-10-01 (×11): 2 via RESPIRATORY_TRACT

## 2020-09-26 MED ORDER — SENNOSIDES-DOCUSATE SODIUM 8.6-50 MG PO TABS
1.0000 | ORAL_TABLET | Freq: Two times a day (BID) | ORAL | Status: DC
  Administered 2020-09-26 – 2020-10-01 (×11): 1 via ORAL
  Filled 2020-09-26 (×12): qty 1

## 2020-09-26 MED ORDER — IPRATROPIUM BROMIDE 0.02 % IN SOLN
0.5000 mg | Freq: Four times a day (QID) | RESPIRATORY_TRACT | Status: DC | PRN
  Filled 2020-09-26: qty 2.5

## 2020-09-26 NOTE — Progress Notes (Signed)
[facility]  PROGRESS NOTE  Dalton Juarez MGQ:[id number] DOB: [birth year] DOA: 09/17/2020 PCP: Smith Robert, MD   Brief HPI:   75 year old male with history of COPD on 2 L/min of oxygen via nasal cannula, OSA on BiPAP nightly, diabetes mellitus type 2, CHF/ischemic cardiomyopathy, atrial fibrillation presented to ED with 2 to 3 weeks history of progressively worsening shortness of breath, and 10 pound weight gain. In the ED he was found to be tachycardic, with A. fib with RVR.  Cardizem drip was started.  Chest x-ray showed bilateral patchy opacity was placed on Levaquin.  Cardiology was consulted, difficult to control rate despite IV amiodarone, considering TEE guided cardioversion, Cardiology requested pulmonary input if TEE can be done safely.    Subjective  Reports having a BM after enema yesterday.  Indicates hemorrhoidal pain.  Per nursing, in and out catheterization for acute urinary retention twice since last night.  Patient reports ongoing dyspnea, better after oxygen humidification.  He feels that he actually got worse since hospitalization.   Assessment/Plan:    Acute on chronic combined systolic and diastolic CHF -Presented with BNP of 445; echo shows EF 49% -Started on Lasix 80 mg IV 3 times daily -Net -11 L thus far and weight down by about 7 pounds. -A. fib with RVR not helping the situation. -Remains volume overloaded and hence continue IV Lasix. -Cardiology continues to follow. -Potassium 3.5, replaced due to being on high-dose IV Lasix. -Steadily rising serum bicarbonate/40 today, follow BMP in a.m. and if continues to rise may have to cut back on Lasix and consider Diamox.  Persistent atrial fibrillation with RVR -Continue Lopressor 50 mg 4 times daily -Continue IV amiodarone -TSH is normal -Started on Eliquis for anticoagulation -Rate remains uncontrolled despite above.  As per Dr. Graciela Husbands, EP cardiology follow-up today, considering TEE guided cardioversion  after pulmonary input  Elevated troponin -Mild elevation of troponin.  35>> 31 -Likely demand ischemia -No ischemic work-up planned per cardiology -He was on DAPT with aspirin and Plavix at home -Aspirin stopped as patient is on Eliquis  Hypertension -Blood pressure fluctuating and mildly uncontrolled.  COPD exacerbation -Continue DuoNeb nebulizer, Solu-Medrol now switched to oral prednisone 40 mg. -Add flutter valve every 4 hours -No clinical bronchospasm.  Acute on chronic respiratory failure with hypoxia -At baseline home oxygen 2 L/min.  Currently on 3 L/min.  OSA on BiPAP at home  Acute kidney injury -In setting of diuresis; creatinine is 1.25 -Resolved but continue to monitor BMP closely due to being on high-dose IV Lasix.  Diabetes mellitus type 2 -Continue sliding scale insulin with NovoLog -CBG well controlled   Hemorrhoids -Anusol HC suppository twice a day, continue.  Bowel regimen to avoid constipation.  Constipation -No improvement with Dulcolax suppository and hence got an enema 8/19  Acute urinary retention -Started on Flomax 0.4 mg daily -In and out cath as needed-thus far has required twice since last night and if he needs it for the third time, will leave indwelling Foley catheter in place.  Discussed with RN.  Leukocytosis -WBC 17,000 ;afebrile, no signs of infection -Likely from steroids  ?  Right-sided facial droop -CT head was negative for stroke -No focal neurological deficit -Appearance of right-sided facial droop could have been due to mask of BiPAP -I did not notice any facial droop.  If there is any recurrent concern for strokelike symptoms, will need MRI brain.  Anemia, suspected due to chronic disease Stable.  Scheduled medications:    apixaban  5  mg Oral BID   atorvastatin  40 mg Oral Daily   chlorhexidine  15 mL Mouth Rinse BID   Chlorhexidine Gluconate Cloth  6 each Topical Daily   cholecalciferol  2,000 Units Oral Daily    clopidogrel  75 mg Oral Daily   dorzolamide  1 drop Left Eye BID   furosemide  80 mg Intravenous TID   gabapentin  300 mg Oral TID   guaiFENesin  1,200 mg Oral BID   hydrocortisone  25 mg Rectal BID   insulin aspart  0-15 Units Subcutaneous TID WC   insulin aspart  0-5 Units Subcutaneous QHS   ipratropium-albuterol  3 mL Nebulization Q6H   mouth rinse  15 mL Mouth Rinse q12n4p   metoprolol tartrate  50 mg Oral QID   mometasone-formoterol  2 puff Inhalation BID   multivitamin with minerals  1 tablet Oral Daily   pantoprazole  40 mg Oral Daily   phenylephrine  1 suppository Rectal BID   potassium chloride  40 mEq Oral Once   predniSONE  40 mg Oral Q breakfast   tamsulosin  0.4 mg Oral Daily         Data Reviewed:   CBG:  Recent Labs  Lab 09/25/20 1208 09/25/20 1623 09/25/20 2152 09/26/20 0752 09/26/20 1147  GLUCAP 137* 257* 170* 120* 166*    SpO2: 99 % O2 Flow Rate (L/min): 3 L/min FiO2 (%): 30 %    Vitals:   09/26/20 0500 09/26/20 0613 09/26/20 0736 09/26/20 0828  BP: 107/61 114/83    Pulse: 97 96    Resp: 16 (!) 23    Temp:    (!) 97.4 F (36.3 C)  TempSrc:    Oral  SpO2: 97% 100% 99%   Weight: 101.2 kg     Height:         Intake/Output Summary (Last 24 hours) at 09/26/2020 1216 Last data filed at 09/26/2020 0650 Gross per 24 hour  Intake 409.92 ml  Output 1550 ml  Net -1140.08 ml    08/18 1901 - 08/20 0700 In: 641.6 [I.V.:641.6] Out: 4153 [Urine:4153]  Filed Weights   09/24/20 0500 09/25/20 0500 09/26/20 0500  Weight: 104.2 kg 101.8 kg 101.2 kg    CBC:  Recent Labs  Lab 09/21/20 0301 09/22/20 0243 09/23/20 0300 09/24/20 0244 09/26/20 0242  WBC 15.5* 16.2* 14.5* 17.6* 17.7*  HGB 11.3* 12.3* 11.6* 12.2* 11.8*  HCT 36.4* 40.1 37.8* 39.6 38.6*  PLT 302 318 285 305 334  MCV 89.4 90.1 89.4 89.2 88.9  MCH 27.8 27.6 27.4 27.5 27.2  MCHC 31.0 30.7 30.7 30.8 30.6  RDW 14.1 13.9 13.9 14.0 13.8    Complete metabolic panel:  Recent Labs   Lab 09/21/20 0301 09/22/20 0243 09/23/20 0300 09/24/20 0244 09/26/20 0242  NA 137 136 141 141 142  K 4.4 4.8 4.1 3.9 3.5  CL 96* 95* 96* 92* 89*  CO2 31 31 34* 38* 40*  GLUCOSE 147* 152* 141* 135* 141*  BUN 50* 54* 54* 48* 44*  CREATININE 1.28* 1.50* 1.37* 1.25* 1.17  CALCIUM 8.8* 8.8* 8.6* 8.7* 8.8*  MG 2.2  --  2.1  --   --   PROCALCITON <0.10 <0.10 <0.10  --   --      Recent Labs  Lab 09/21/20 0301 09/22/20 0243 09/23/20 0300  PROCALCITON <0.10 <0.10 <0.10      Lab Results  Component Value Date   HGBA1C 6.3 (H) 09/17/2020       Component  Value Date/Time   BNP 445.0 (H) 09/17/2020 1314     Antibiotics: Anti-infectives (From admission, onward)    Start     Dose/Rate Route Frequency Ordered Stop   09/17/20 1415  levofloxacin (LEVAQUIN) IVPB 500 mg        500 mg 100 mL/hr over 60 Minutes Intravenous  Once 09/17/20 1400 09/17/20 1818        Radiology Reports  CT HEAD CODE STROKE WO CONTRAST  Result Date: 09/25/2020 CLINICAL DATA:  Code stroke. Acute onset left pupil change, right facial droop EXAM: CT HEAD WITHOUT CONTRAST TECHNIQUE: Contiguous axial images were obtained from the base of the skull through the vertex without intravenous contrast. COMPARISON:  None. FINDINGS: Brain: There is no evidence of acute intracranial hemorrhage, extra-axial fluid collection, or infarct. There is mild parenchymal volume loss with commensurate enlargement of the ventricular system. Foci of hypodensity in the subcortical and periventricular white matter likely reflect mild chronic white matter microangiopathy. There is no mass lesion. There is no midline shift. Vascular: There is calcification of the bilateral cavernous ICAs. Skull: Normal. Negative for fracture or focal lesion. Sinuses/Orbits: The imaged paranasal sinuses are clear. The patient is status post right lens implant. The globes and orbits are otherwise unremarkable. Other: There is a remote right nasal bone  fracture. ASPECTS [facility] Stroke Program Early CT Score) - Ganglionic level infarction (caudate, lentiform nuclei, internal capsule, insula, M1-M3 cortex): 3 - Supraganglionic infarction (M4-M6 cortex): 7 Total score (0-10 with 10 being normal): 10 IMPRESSION: 1. No acute intracranial pathology 2. ASPECTS is 10 3. Mild parenchymal volume loss and chronic white matter microangiopathy. These results were called by telephone at the time of interpretation on 09/25/2020 at 10:08 am to provider [facility] , who verbally acknowledged these results. Electronically Signed   By: Lesia Hausen M.D.   On: 09/25/2020 10:09      DVT prophylaxis: Apixaban  Code Status: Full code  Family Communication: None at bedside   Consultants: Cardiology  Procedures: BiPAP at night    Objective    Physical Examination:  General exam: Elderly male, moderately built and obese, ill looking, lying propped up in bed without distress. Respiratory system: Harsh and diminished breath sounds bilaterally, more so in the bases.  Occasional bibasilar crackles.  No wheezing or rhonchi.  Able to speak in short sentences.  No increased work of breathing. Cardiovascular system: S1 & S2 heard, irregularly irregular. No JVD, murmurs, rubs, gallops or clicks.  1+ pitting bilateral leg edema. Gastrointestinal system: Abdomen is nondistended, soft and nontender. No organomegaly or masses felt. Normal bowel sounds heard. Central nervous system: Alert and oriented. No focal neurological deficits. Extremities: Symmetric 5 x 5 power. Skin: No rashes, lesions or ulcers Psychiatry: Judgement and insight appear normal. Mood & affect appropriate.   Status is: Inpatient  Dispo: The patient is from: Home              Anticipated d/c is to: SNF              Anticipated d/c date is: 3 to 4 days              Patient currently currently not stable for discharge  Barrier to discharge-ongoing treatment for A. fib with RVR  COVID-19  Labs  No results for input(s): DDIMER, FERRITIN, LDH, CRP in the last 72 hours.  Lab Results  Component Value Date   SARSCOV2NAA NEGATIVE 09/17/2020    Microbiology  Recent Results (from the past  240 hour(s))  Resp Panel by RT-PCR (Flu A&B, Covid) Nasopharyngeal Swab     Status: None   Collection Time: 09/17/20  6:00 PM   Specimen: Nasopharyngeal Swab; Nasopharyngeal(NP) swabs in vial transport medium  Result Value Ref Range Status   SARS Coronavirus 2 by RT PCR NEGATIVE NEGATIVE Final    Comment: (NOTE) SARS-CoV-2 target nucleic acids are NOT DETECTED.  The SARS-CoV-2 RNA is generally detectable in upper respiratory specimens during the acute phase of infection. The lowest concentration of SARS-CoV-2 viral copies this assay can detect is 138 copies/mL. A negative result does not preclude SARS-Cov-2 infection and should not be used as the sole basis for treatment or other patient management decisions. A negative result may occur with  improper specimen collection/handling, submission of specimen other than nasopharyngeal swab, presence of viral mutation(s) within the areas targeted by this assay, and inadequate number of viral copies(<138 copies/mL). A negative result must be combined with clinical observations, patient history, and epidemiological information. The expected result is Negative.  Fact Sheet for Patients:  BloggerCourse.com  Fact Sheet for Healthcare Providers:  SeriousBroker.it  This test is no t yet approved or cleared by the [place] FDA and  has been authorized for detection and/or diagnosis of SARS-CoV-2 by FDA under an Emergency Use Authorization (EUA). This EUA will remain  in effect (meaning this test can be used) for the duration of the COVID-19 declaration under Section 564(b)(1) of the Act, 21 U.S.C.section 360bbb-3(b)(1), unless the authorization is terminated  or revoked sooner.        Influenza A by PCR NEGATIVE NEGATIVE Final   Influenza B by PCR NEGATIVE NEGATIVE Final    Comment: (NOTE) The Xpert Xpress SARS-CoV-2/FLU/RSV plus assay is intended as an aid in the diagnosis of influenza from Nasopharyngeal swab specimens and should not be used as a sole basis for treatment. Nasal washings and aspirates are unacceptable for Xpert Xpress SARS-CoV-2/FLU/RSV testing.  Fact Sheet for Patients: BloggerCourse.com  Fact Sheet for Healthcare Providers: SeriousBroker.it  This test is not yet approved or cleared by the [place] FDA and has been authorized for detection and/or diagnosis of SARS-CoV-2 by FDA under an Emergency Use Authorization (EUA). This EUA will remain in effect (meaning this test can be used) for the duration of the COVID-19 declaration under Section 564(b)(1) of the Act, 21 U.S.C. section 360bbb-3(b)(1), unless the authorization is terminated or revoked.  Performed at [facility], [address], [place], [place] [address]   MRSA Next Gen by PCR, Nasal     Status: Abnormal   Collection Time: 09/18/20 12:17 AM   Specimen: Nasal Mucosa; Nasal Swab  Result Value Ref Range Status   MRSA by PCR Next Gen DETECTED (A) NOT DETECTED Final    Comment: RESULT CALLED TO, READ BACK BY AND VERIFIED WITH: DANIELLE, RN @ [phone] ON 09/18/20 C VARNER (NOTE) The GeneXpert MRSA Assay (FDA approved for NASAL specimens only), is one component of a comprehensive MRSA colonization surveillance program. It is not intended to diagnose MRSA infection nor to guide or monitor treatment for MRSA infections. Test performance is not FDA approved in patients less than 51 years old. Performed at [facility], [address] [place], [place] [address]         Marcellus Scott, MD, FACP, [facility]. [facility]  To contact the attending provider between 7A-7P or the covering provider during after hours  7P-7A, please log into the web site [email or web address] and access using universal [facility] password for that web site. If you do not have the password, please call the hospital operator.    09/26/2020, 12:16 PM  LOS: 9 days

## 2020-09-26 NOTE — Consult Note (Addendum)
NAME:  Dalton Juarez, MRN:  [id number], DOB:  18-Mar-1945, LOS: 9 ADMISSION DATE:  09/17/2020, CONSULTATION DATE:  09/26/20 REFERRING MD:  Graciela Husbands cardiology, CHIEF COMPLAINT:  shortness of breath   History of Present Illness:  75 year old with reported history of COPD based on PFTs about a decade ago, thoracotomy and volume loss on the right after hemothorax following thoracentesis 2018, cardiomyopathy EF 35% 2018 (a bit better on TTE this admission) who presents with acute on chronic dyspnea on exertion found to be in A. fib with RVR.  History from patient and partner at bedside.  Sound like a longstanding history of progressive dyspnea on exertion.  Diagnosed with COPD based on PFTs from I can tell 2013 or so.  Cannot view these.  Since he has been on various inhaled medications.  He reports multiple allergies to inhaled and nebulized medicines.  He thought his shortness of breath seem to worsen with onset of A. fib although partner says that he has had progressive worsening of dyspnea.  Notes that he is minimally active.  Chest x-ray on admission shows volume loss on the right with small pleural effusions, mild volume overload on my interpretation.  Chest x-ray 09/23/2018 compared to admission shows similar volume loss on the right with improved interstitial markings on my interpretation.  Pertinent  Medical History  CHF, A. fib, COPD on 2 L nasal cannula baseline  Significant Hospital Events: Including procedures, antibiotic start and stop dates in addition to other pertinent events   8/20 PCCM consult for COPD  Interim History / Subjective:    Objective   Blood pressure (!) 134/105, pulse (!) 122, temperature 98.3 F (36.8 C), temperature source Oral, resp. rate (!) 23, height 5\' 7"  (1.702 m), weight 101.2 kg, SpO2 95 %.    FiO2 (%):  [30 %] 30 %   Intake/Output Summary (Last 24 hours) at 09/26/2020 1418 Last data filed at 09/26/2020 1300 Gross per 24 hour  Intake 575.53 ml  Output 2300  ml  Net -1724.47 ml   Filed Weights   09/24/20 0500 09/25/20 0500 09/26/20 0500  Weight: 104.2 kg 101.8 kg 101.2 kg    Examination: General: Obese, weak, sitting up in chair Eyes: EOMI, icterus Neck: Supple, JVP hard to assess given habitus, short neck Pulmonary: Rhonchorous, junky, clearly sounds like phlegm or secretions in upper airways, mild end  expiratory wheeze Cardiovascular: Irregularly irregular, tachycardic, edema noted in lower extremities and upper extremities Abdomen: Rotund, soft MSK: No synovitis, joint effusion Neuro: Diffusely weak without focal abnormality, difficulty sitting forward, weak cough, sensation intact Psych: Flat affect, depressed mood  Resolved Hospital Problem list     Assessment & Plan:  COPD: Based on PFTs in the past.  I cannot view these.  Intolerance to multiple bronchodilators and inhaled therapies.  Sounds wheezy.  Unclear how much of this is contribution from underlying obstructive lung disease versus mild volume overload.  We will try to optimize inhaled therapies.  Agree with ongoing prednisone for presumed exacerbation given cough, shortness of breath although given his weakness will end soon.  Of great concern is how weak he is specifically, how weak his cough is.  He sound like he has a lot of congestion in his airways is not adequately mobilizing.  He has a reaction to hypertonic saline which makes treatment of airway clearance difficult. -- Yupelri nebulized, high-dose Dulera inhaler (prefer nebulized budesonide and arformoterol for full nebulized therapy given worry about mechanics, weakness, and technique with inhalers but he  reports multiple allergies and events to both arformoterol and budesonide) --Stop albuterol given A. 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  Difficult to control.  Driven by structural heart disease as well as parenchymal lung disease. --Worry about his ability to tolerate cardioversion and TEE specifically with his history of obstructive sleep apnea with him at very high risk for moderate sedation, in addition he has developed significant weakness given the long duration of his hospitalization which further increases his risk of intolerance to moderate sedation, in addition to his already tenuous cardiopulmonary status at baseline that is worse in the setting of uncontrolled atrial fibrillation.  If cardioversion needed would consider intubation and ET tube although this brings up risk of inability to wean from ventilator once procedure is over.  Defer ultimate decision to cardiology provider but do have significant reservations about tolerating the procedure.  Ultimately if as the only option to advance his care and would consider doing with general anesthesia and a secure airway.  Best Practice (right click and "Reselect all SmartList Selections" daily)   Per primary  Labs   CBC: Recent Labs  Lab 09/21/20 0301 09/22/20 0243 09/23/20 0300 09/24/20 0244 09/26/20 0242  WBC 15.5* 16.2* 14.5* 17.6* 17.7*  HGB 11.3* 12.3* 11.6* 12.2* 11.8*  HCT 36.4* 40.1 37.8* 39.6 38.6*  MCV 89.4 90.1 89.4 89.2 88.9  PLT 302 318 285 305 334    Basic Metabolic Panel: Recent Labs  Lab 09/21/20 0301 09/22/20 0243 09/23/20 0300 09/24/20 0244 09/26/20 0242  NA 137 136 141 141 142  K 4.4 4.8 4.1 3.9 3.5  CL 96* 95* 96* 92* 89*  CO2 31 31 34* 38* 40*  GLUCOSE 147* 152* 141* 135* 141*  BUN 50* 54* 54* 48* 44*  CREATININE 1.28* 1.50* 1.37* 1.25* 1.17  CALCIUM 8.8* 8.8* 8.6* 8.7* 8.8*  MG 2.2  --  2.1  --   --     GFR: Estimated Creatinine Clearance: 59.9 mL/min (by C-G formula based on SCr of 1.17 mg/dL). Recent Labs  Lab 09/21/20 0301 09/22/20 0243 09/23/20 0300 09/24/20 0244 09/26/20 0242  PROCALCITON <0.10 <0.10 <0.10  --   --   WBC 15.5* 16.2* 14.5* 17.6* 17.7*    Liver Function Tests: No results for input(s): AST, ALT, ALKPHOS, BILITOT, PROT, ALBUMIN in the last 168 hours. No results for input(s): LIPASE, AMYLASE in the last 168 hours. No results for input(s): AMMONIA in the last 168 hours.  ABG No results found for: PHART, PCO2ART, PO2ART, HCO3, TCO2, ACIDBASEDEF, O2SAT   Coagulation Profile: No results for input(s): INR, PROTIME in the last 168 hours.  Cardiac Enzymes: No results for input(s): CKTOTAL, CKMB, CKMBINDEX, TROPONINI in the last 168 hours.  HbA1C: Hgb A1c MFr Bld  Date/Time Value Ref Range Status  09/17/2020 01:14 PM 6.3 (H) 4.8 - 5.6 % Final    Comment:    (NOTE) Pre diabetes:          5.7%-6.4%  Diabetes:              >6.4%  Glycemic control for   <7.0% adults with diabetes     CBG: Recent Labs  Lab 09/25/20 1208  09/25/20 1623 09/25/20 2152 09/26/20 0752 09/26/20 1147  GLUCAP 137* 257* 170* 120* 166*    Review of Systems:   No chest pain.  No orthopnea or PND.  Wife thinks lower extremity swelling is improved.  Comprehensive review of systems otherwise negative.  Past Medical History:  He,  has a past medical history of Acute on chronic right-sided congestive heart failure (HCC), Anemia in other chronic diseases classified elsewhere, Atrial fibrillation (HCC), CAD (coronary artery disease), COPD (chronic obstructive pulmonary disease) (HCC), Ischemic cardiomyopathy, Seasonal allergic rhinitis due to pollen, Tachypnea, and Vitamin D deficiency.   Surgical History:  History reviewed. No pertinent surgical history.   Social History:   reports that he has quit smoking. His smoking use included cigarettes. He does not have any smokeless  tobacco history on file. He reports that he does not currently use alcohol. He reports that he does not use drugs.   Family History:  His family history includes Hypertension in his mother. There is no history of CAD, Heart failure, or Stroke.   Allergies Allergies  Allergen Reactions   Advair Hfa [Fluticasone-Salmeterol]    Amiodarone Other (See Comments)    Bradycardia   Anoro Ellipta [Umeclidinium-Vilanterol]    Breo Ellipta [Fluticasone Furoate-Vilanterol]    Brovana [Arformoterol]    Budesonide    Entresto [Sacubitril-Valsartan]    Oxycodone Other (See Comments)    Required Narcan gtt after 10 of oxycodone for obtundation   Spiriva Respimat [Tiotropium Bromide Monohydrate]      Home Medications  Prior to Admission medications   Medication Sig Start Date End Date Taking? Authorizing Provider  albuterol (PROVENTIL) (2.5 MG/3ML) 0.083% nebulizer solution Take 2.5 mg by nebulization 3 (three) times daily. 07/11/20  Yes [provider]  albuterol (VENTOLIN HFA) 108 (90 Base) MCG/ACT inhaler Inhale into the lungs. 01/05/16  Yes [provider]  amoxicillin (AMOXIL) 500 MG capsule Take 500 mg by mouth 2 (two) times daily.   Yes [provider]  aspirin 81 MG EC tablet Take by mouth.   Yes [provider]  atorvastatin (LIPITOR) 40 MG tablet Take 40 mg by mouth daily as needed. 09/12/20  Yes [provider]  Cholecalciferol (VITAMIN D3) 50 MCG (2000 UT) TABS Take 1 tablet by mouth daily.   Yes [provider]  clopidogrel (PLAVIX) 75 MG tablet Take 75 mg by mouth daily. 09/12/20  Yes [provider]  dorzolamide (TRUSOPT) 2 % ophthalmic solution Place 1 drop into the left eye 2 (two) times daily. 09/14/20  Yes [provider]  furosemide (LASIX) 20 MG tablet Take 20 mg by mouth daily. 08/24/20  Yes [provider]  gabapentin (NEURONTIN) 300 MG capsule Take 300 mg by mouth 3 (three) times daily. 08/24/20  Yes  [provider]  Ipratropium-Albuterol (COMBIVENT RESPIMAT) 20-100 MCG/ACT AERS respimat Inhale 1 puff into the lungs 4 (four) times daily.   Yes [provider]  metFORMIN (GLUCOPHAGE-XR) 500 MG 24 hr tablet Take 500 mg by mouth 2 (two) times daily. 08/24/20  Yes [provider]  metoprolol tartrate (LOPRESSOR) 25 MG tablet Take 25 mg by mouth in the morning, at noon, and at bedtime. 08/24/20  Yes [provider]  Multiple Vitamin (MULTI-VITAMIN) tablet Take 1 tablet by mouth daily.   Yes [provider]  nitroGLYCERIN (NITROSTAT) 0.4 MG SL tablet Place under the tongue. 08/24/20  Yes [provider]  omeprazole (PRILOSEC) 20 MG capsule Take 40 mg by mouth daily. 08/24/20  Yes  [provider]  OXYGEN Inhale 2 L into the lungs daily.   Yes [provider]  SYMBICORT 160-4.5 MCG/ACT inhaler Inhale 2 puffs into the lungs 2 (two) times daily. 08/24/20  Yes [provider]  azithromycin (ZITHROMAX) 250 MG tablet Take 250 mg by mouth See admin instructions. Take 250 mg on Monday, Wednesday and Friday Patient not taking: No sig reported    [provider]  fluticasone (FLONASE) 50 MCG/ACT nasal spray Place 1 spray into both nostrils daily. Patient not taking: No sig reported    [provider]     Critical care time: n/a    I spent 65 minutes in care of the patient including face-to-face visit, review of records, coordination of care.

## 2020-09-26 NOTE — Progress Notes (Signed)
Progress Note  Patient Name: Dalton Juarez Date of Encounter: 09/26/2020  [facility] Cardiologist: Thurmon Fair, MD     Patient Profile     75 y.o. male with a history of CAD with prior stenting on DAPT ischemic cardiomyopathy/chronic systolic CHF, paroxysmal atrial fibrillation not on anticoagulation at home, s/p VATS decortication with mechanical pleurodesis at [facility], COPD on home O2, hypertension, hyperlipidemia, type 2 diabetes, GERD, and prior tobacco use (quit 4 years ago) admitted for shortness of breath progressive over months and was found to be in atrial fibrillation with a rapid rate.  Troponins were negative treated with BiPAP. CXR>> patchy infiltrates>>abx Heparin started>>,Apixoban  Amiodarone (IV) started Echo EF 40-45%  Subjective  No chest pain but extremely dyspneic.  Unaware of atrial fibrillation  Inpatient Medications    Scheduled Meds:  apixaban  5 mg Oral BID   atorvastatin  40 mg Oral Daily   chlorhexidine  15 mL Mouth Rinse BID   Chlorhexidine Gluconate Cloth  6 each Topical Daily   cholecalciferol  2,000 Units Oral Daily   clopidogrel  75 mg Oral Daily   dorzolamide  1 drop Left Eye BID   furosemide  80 mg Intravenous TID   gabapentin  300 mg Oral TID   guaiFENesin  1,200 mg Oral BID   hydrocortisone  25 mg Rectal BID   insulin aspart  0-15 Units Subcutaneous TID WC   insulin aspart  0-5 Units Subcutaneous QHS   ipratropium-albuterol  3 mL Nebulization Q6H   mouth rinse  15 mL Mouth Rinse q12n4p   metoprolol tartrate  50 mg Oral QID   mometasone-formoterol  2 puff Inhalation BID   multivitamin with minerals  1 tablet Oral Daily   pantoprazole  40 mg Oral Daily   phenylephrine  1 suppository Rectal BID   predniSONE  40 mg Oral Q breakfast   tamsulosin  0.4 mg Oral Daily   Continuous Infusions:  sodium chloride Stopped (09/20/20 2035)   amiodarone 30 mg/hr (09/26/20 0650)   PRN Meds: sodium chloride, acetaminophen **OR** acetaminophen,  guaiFENesin-dextromethorphan, ipratropium-albuterol, metoprolol tartrate, nitroGLYCERIN, witch hazel-glycerin   Vital Signs    Vitals:   09/26/20 0500 09/26/20 0613 09/26/20 0736 09/26/20 0828  BP: 107/61 114/83    Pulse: 97 96    Resp: 16 (!) 23    Temp:    (!) 97.4 F (36.3 C)  TempSrc:    Oral  SpO2: 97% 100% 99%   Weight: 101.2 kg     Height:        Intake/Output Summary (Last 24 hours) at 09/26/2020 0953 Last data filed at 09/26/2020 0650 Gross per 24 hour  Intake 409.92 ml  Output 2050 ml  Net -1640.08 ml    Last 3 Weights 09/26/2020 09/25/2020 09/24/2020  Weight (lbs) 223 lb 1.7 oz 224 lb 6.9 oz 229 lb 11.5 oz  Weight (kg) 101.2 kg 101.8 kg 104.2 kg      Telemetry   Telemetry Personally reviewed atrial fibrillation with rates from 90--140  ECG    No new tracing - Personally Reviewed  Physical Exam   Well developed disheveled in modest respiratory distress  HENT normal Neck supple with JVP-angle of the jaw Diffuse rhonchi  Irregularly irregular and rapid rate and rhythm,  Abd-soft with active BS No Clubbing cyanosis 1+ edema Skin-warm and dry A & Oriented  Grossly normal sensory and motor function  ECG     Labs    High Sensitivity Troponin:   Recent  Labs  Lab 09/17/20 1314 09/17/20 1522  TROPONINIHS 35* 31*       Chemistry Recent Labs  Lab 09/23/20 0300 09/24/20 0244 09/26/20 0242  NA 141 141 142  K 4.1 3.9 3.5  CL 96* 92* 89*  CO2 34* 38* 40*  GLUCOSE 141* 135* 141*  BUN 54* 48* 44*  CREATININE 1.37* 1.25* 1.17  CALCIUM 8.6* 8.7* 8.8*  GFRNONAA 53* 59* >60  ANIONGAP 11 11 13       Hematology Recent Labs  Lab 09/23/20 0300 09/24/20 0244 09/26/20 0242  WBC 14.5* 17.6* 17.7*  RBC 4.23 4.44 4.34  HGB 11.6* 12.2* 11.8*  HCT 37.8* 39.6 38.6*  MCV 89.4 89.2 88.9  MCH 27.4 27.5 27.2  MCHC 30.7 30.8 30.6  RDW 13.9 14.0 13.8  PLT 285 305 334     BNPNo results for input(s): BNP, PROBNP in the last 168 hours.   DDimer No  results for input(s): DDIMER in the last 168 hours.   Radiology    CT HEAD CODE STROKE WO CONTRAST  Result Date: 09/25/2020 CLINICAL DATA:  Code stroke. Acute onset left pupil change, right facial droop EXAM: CT HEAD WITHOUT CONTRAST TECHNIQUE: Contiguous axial images were obtained from the base of the skull through the vertex without intravenous contrast. COMPARISON:  None. FINDINGS: Brain: There is no evidence of acute intracranial hemorrhage, extra-axial fluid collection, or infarct. There is mild parenchymal volume loss with commensurate enlargement of the ventricular system. Foci of hypodensity in the subcortical and periventricular white matter likely reflect mild chronic white matter microangiopathy. There is no mass lesion. There is no midline shift. Vascular: There is calcification of the bilateral cavernous ICAs. Skull: Normal. Negative for fracture or focal lesion. Sinuses/Orbits: The imaged paranasal sinuses are clear. The patient is status post right lens implant. The globes and orbits are otherwise unremarkable. Other: There is a remote right nasal bone fracture. ASPECTS [facility] Stroke Program Early CT Score) - Ganglionic level infarction (caudate, lentiform nuclei, internal capsule, insula, M1-M3 cortex): 3 - Supraganglionic infarction (M4-M6 cortex): 7 Total score (0-10 with 10 being normal): 10 IMPRESSION: 1. No acute intracranial pathology 2. ASPECTS is 10 3. Mild parenchymal volume loss and chronic white matter microangiopathy. These results were called by telephone at the time of interpretation on 09/25/2020 at 10:08 am to provider [facility] , who verbally acknowledged these results. Electronically Signed   By: [facility] M.D.   On: 09/25/2020 10:09    Cardiac Studies   Echocardiogram 09/18/2020: Impressions:  1. Left ventricular ejection fraction by 3D volume is 49 %. The left  ventricle has mildly decreased function. The left ventricle has no  regional wall motion abnormalities.  There is mild left ventricular  hypertrophy. Left ventricular diastolic parameters   are indeterminate.   2. Right ventricular systolic function is normal. The right ventricular  size is normal. There is mildly elevated pulmonary artery systolic  pressure. The estimated right ventricular systolic pressure is 40.5 mmHg.   3. Right atrial size was moderately dilated.   4. The mitral valve is normal in structure. Moderate mitral valve  regurgitation. No evidence of mitral stenosis.   5. The aortic valve is normal in structure. Aortic valve regurgitation is  not visualized. Mild to moderate aortic valve sclerosis/calcification is  present, without any evidence of aortic stenosis.   6. The inferior vena cava is normal in size with greater than 50%   respiratory variability, suggesting right atrial pressure of 3 mmHg.  Assessment & Plan  Atrial fibrillation-persistent  Respiratory failure  Congestive heart failure acute/chronic/combined  Pneumonia  Ischemic heart disease with prior stenting troponin is negative  COPD/OSA on BiPAP  Ongoing respiratory insufficiency.  According to nursing he tolerates BiPAP well but appears more tired.  He expresses that he is exhausted.  I think trying to restore sinus rhythm offers [place] our best opportunity to impact his respiratory status from a cardiovascular point of view.  To do this would require TEE guided cardioversion and so of asked to get pulmonary to help assess whether this could be done safely.  For now, we will continue IV amiodarone for rate control albeit insufficient.  He remains volume overloaded.  We will continue IV diuresis.  Yesterday's output was about 2 L.  He is also now increasingly hypercarbic.  We will try chloride repletion with potassium  .i    Sherryl Manges, MD

## 2020-09-27 DIAGNOSIS — J441 Chronic obstructive pulmonary disease with (acute) exacerbation: Secondary | ICD-10-CM | POA: Diagnosis not present

## 2020-09-27 DIAGNOSIS — N179 Acute kidney failure, unspecified: Secondary | ICD-10-CM | POA: Diagnosis not present

## 2020-09-27 DIAGNOSIS — I5043 Acute on chronic combined systolic (congestive) and diastolic (congestive) heart failure: Secondary | ICD-10-CM | POA: Diagnosis not present

## 2020-09-27 DIAGNOSIS — I4891 Unspecified atrial fibrillation: Secondary | ICD-10-CM | POA: Diagnosis not present

## 2020-09-27 LAB — GLUCOSE, CAPILLARY
Glucose-Capillary: 119 mg/dL — ABNORMAL HIGH (ref 70–99)
Glucose-Capillary: 157 mg/dL — ABNORMAL HIGH (ref 70–99)
Glucose-Capillary: 144 mg/dL — ABNORMAL HIGH (ref 70–99)
Glucose-Capillary: 168 mg/dL — ABNORMAL HIGH (ref 70–99)

## 2020-09-27 LAB — CBC
HCT: 40.2 % (ref 39.0–52.0)
Hemoglobin: 12.3 g/dL — ABNORMAL LOW (ref 13.0–17.0)
MCH: 27.5 pg (ref 26.0–34.0)
MCHC: 30.6 g/dL (ref 30.0–36.0)
MCV: 89.7 fL (ref 80.0–100.0)
Platelets: 224 10*3/uL (ref 150–400)
RBC: 4.48 MIL/uL (ref 4.22–5.81)
RDW: 14 % (ref 11.5–15.5)
WBC: 16.1 10*3/uL — ABNORMAL HIGH (ref 4.0–10.5)
nRBC: 0 % (ref 0.0–0.2)

## 2020-09-27 LAB — BASIC METABOLIC PANEL
Anion gap: 10 (ref 5–15)
BUN: 41 mg/dL — ABNORMAL HIGH (ref 8–23)
CO2: 41 mmol/L — ABNORMAL HIGH (ref 22–32)
Calcium: 9 mg/dL (ref 8.9–10.3)
Chloride: 88 mmol/L — ABNORMAL LOW (ref 98–111)
Creatinine, Ser: 1.13 mg/dL (ref 0.61–1.24)
GFR, Estimated: >60 mL/min (ref >60)
Glucose, Bld: 129 mg/dL — ABNORMAL HIGH (ref 70–99)
Potassium: 4.7 mmol/L (ref 3.5–5.1)
Sodium: 139 mmol/L (ref 135–145)

## 2020-09-27 MED ORDER — BISOPROLOL FUMARATE 5 MG PO TABS
5.0000 mg | ORAL_TABLET | Freq: Two times a day (BID) | ORAL | Status: DC
  Administered 2020-09-27 – 2020-10-01 (×10): 5 mg via ORAL
  Filled 2020-09-27 (×10): qty 1

## 2020-09-27 MED ORDER — REVEFENACIN 175 MCG/3ML IN SOLN
175.0000 ug | Freq: Every day | RESPIRATORY_TRACT | Status: DC
  Administered 2020-09-27 – 2020-10-01 (×5): 175 ug via RESPIRATORY_TRACT
  Filled 2020-09-27 (×6): qty 3

## 2020-09-27 MED ORDER — DILTIAZEM HCL 30 MG PO TABS
30.0000 mg | ORAL_TABLET | Freq: Four times a day (QID) | ORAL | Status: DC
  Administered 2020-09-27 – 2020-09-28 (×4): 30 mg via ORAL
  Filled 2020-09-27 (×5): qty 1

## 2020-09-27 MED ORDER — FUROSEMIDE 10 MG/ML IJ SOLN
80.0000 mg | Freq: Two times a day (BID) | INTRAMUSCULAR | Status: DC
  Administered 2020-09-27 – 2020-09-28 (×2): 80 mg via INTRAVENOUS
  Filled 2020-09-27 (×2): qty 8

## 2020-09-27 MED ORDER — PHENAZOPYRIDINE HCL 100 MG PO TABS
100.0000 mg | ORAL_TABLET | Freq: Three times a day (TID) | ORAL | Status: AC
  Administered 2020-09-27 – 2020-09-28 (×3): 100 mg via ORAL
  Filled 2020-09-27 (×3): qty 1

## 2020-09-27 MED ORDER — ACETAZOLAMIDE 250 MG PO TABS
250.0000 mg | ORAL_TABLET | Freq: Two times a day (BID) | ORAL | Status: AC
  Administered 2020-09-27 (×2): 250 mg via ORAL
  Filled 2020-09-27 (×3): qty 1

## 2020-09-27 NOTE — Progress Notes (Signed)
CPT held due to patient sleeping with BIPAP on

## 2020-09-27 NOTE — Progress Notes (Signed)
CPT held at this time due to patient sleeping on BIPAP

## 2020-09-27 NOTE — Progress Notes (Signed)
[facility]  PROGRESS NOTE  Gary Gabrielsen JGG:[id number] DOB: 1945-09-15 DOA: 09/17/2020 PCP: Smith Robert, MD   Brief HPI:   75 year old male with history of COPD on 2 L/min of oxygen via nasal cannula, diabetes mellitus type 2, CHF/ischemic cardiomyopathy, atrial fibrillation presented to ED with 2 to 3 weeks history of progressively worsening shortness of breath, 10 pound weight gain. In the ED he was found to be tachycardic, with A. fib with RVR.  Cardizem drip was started.  Chest x-ray showed bilateral patchy opacity was placed on Levaquin.  Cardiology was consulted    Subjective   Patient seen and examined, has been having multiple episodes of and out cath for acute urinary retention.  Continues to complains of shortness of breath   Assessment/Plan:    Acute on chronic combined systolic and diastolic CHF -Presented with BNP of 445; echo shows EF 49% -Started on Lasix 80 mg IV 3 times daily -Net -13 liters -Continue to monitor intake and output -CO2 elevated 41; will give 2 doses of Diamox to 50 mg p.o. twice daily -We will change Lasix to 80 mg IV every 12 hours -cardiology following  Atrial fibrillation with RVR -Heart rate is controlled -Continue Lopressor 50 mg 3 times daily -Continue IV amiodarone -TSH is normal -Started on Eliquis for anticoagulation -Cardiology was in templating TEE guided cardioversion, pulmonology was consulted.  He does not feel that patient can undergo cardioversion under sedation and have to be put under general esthesia general esthesia with intubation  Positive troponin -Mild elevation of troponin.  35>> 32 -Likely demand ischemia -No ischemic work-up planned per cardiology -He was on DAPT with aspirin and Plavix at home -Aspirin stopped as patient is on Eliquis  Hypertension -Blood pressure is stable  COPD exacerbation -Continue DuoNeb nebulizer, Solu-Medrol -Add flutter valve every 4 hours -Continue Mucinex, prednisone 40  mg daily  Acute kidney injury -In setting of diuresis; creatinine is 1.25 -We will closely monitor patient's creatinine  Diabetes mellitus type 2 -Continue sliding scale insulin with NovoLog -CBG well controlled   Hemorrhoids -Anusol HC suppository twice a day  Constipation -No improvement with Dulcolax suppository -We will give milk and molasses enema  Urinary retention -Started on Flomax 0.4 mg daily -Had multiple episodes requiring in and out cath -Will insert Foley catheter  Leukocytosis -WBC 17,000 ;afebrile, no signs of infection -Likely from steroids  ?  Right-sided facial droop -CT head was negative for stroke -No focal neurological deficit -Appearance of right-sided facial droop could have been due to mask of BiPAP -Resolved  Scheduled medications:    apixaban  5 mg Oral BID   atorvastatin  40 mg Oral Daily   bisoprolol  5 mg Oral BID   chlorhexidine  15 mL Mouth Rinse BID   Chlorhexidine Gluconate Cloth  6 each Topical Daily   cholecalciferol  2,000 Units Oral Daily   clopidogrel  75 mg Oral Daily   diltiazem  30 mg Oral Q6H   dorzolamide  1 drop Left Eye BID   furosemide  80 mg Intravenous TID   gabapentin  300 mg Oral TID   guaiFENesin  1,200 mg Oral BID   hydrocortisone  25 mg Rectal BID   insulin aspart  0-15 Units Subcutaneous TID WC   insulin aspart  0-5 Units Subcutaneous QHS   mouth rinse  15 mL Mouth Rinse q12n4p   mometasone-formoterol  2 puff Inhalation BID   multivitamin with minerals  1 tablet Oral Daily   pantoprazole  40 mg Oral Daily   phenylephrine  1 suppository Rectal BID   polyethylene glycol  17 g Oral BID   revefenacin  175 mcg Nebulization Daily   senna-docusate  1 tablet Oral BID   tamsulosin  0.4 mg Oral Daily         Data Reviewed:   CBG:  Recent Labs  Lab 09/26/20 1147 09/26/20 1703 09/26/20 2209 09/27/20 0814 09/27/20 1158  GLUCAP 166* 170* 161* 119* 157*    SpO2: 98 % O2 Flow Rate (L/min): 5  L/min FiO2 (%): 40 %    Vitals:   09/27/20 0900 09/27/20 0905 09/27/20 1130 09/27/20 1146  BP:   137/79   Pulse: 92     Resp: 15     Temp:  (!) 97.3 F (36.3 C)    TempSrc:  Axillary    SpO2:    98%  Weight:      Height:         Intake/Output Summary (Last 24 hours) at 09/27/2020 1240 Last data filed at 09/27/2020 0800 Gross per 24 hour  Intake 522.55 ml  Output 2381 ml  Net -1858.45 ml    08/19 1901 - 08/21 0700 In: 1015.6 [P.O.:480; I.V.:535.6] Out: 3506 [Urine:3505]  Filed Weights   09/25/20 0500 09/26/20 0500 09/27/20 0400  Weight: 101.8 kg 101.2 kg 100 kg    CBC:  Recent Labs  Lab 09/22/20 0243 09/23/20 0300 09/24/20 0244 09/26/20 0242 09/27/20 0306  WBC 16.2* 14.5* 17.6* 17.7* 16.1*  HGB 12.3* 11.6* 12.2* 11.8* 12.3*  HCT 40.1 37.8* 39.6 38.6* 40.2  PLT 318 285 305 334 224  MCV 90.1 89.4 89.2 88.9 89.7  MCH 27.6 27.4 27.5 27.2 27.5  MCHC 30.7 30.7 30.8 30.6 30.6  RDW 13.9 13.9 14.0 13.8 14.0    Complete metabolic panel:  Recent Labs  Lab 09/21/20 0301 09/22/20 0243 09/23/20 0300 09/24/20 0244 09/26/20 0242 09/27/20 0306  NA 137 136 141 141 142 139  K 4.4 4.8 4.1 3.9 3.5 4.7  CL 96* 95* 96* 92* 89* 88*  CO2 31 31 34* 38* 40* 41*  GLUCOSE 147* 152* 141* 135* 141* 129*  BUN 50* 54* 54* 48* 44* 41*  CREATININE 1.28* 1.50* 1.37* 1.25* 1.17 1.13  CALCIUM 8.8* 8.8* 8.6* 8.7* 8.8* 9.0  MG 2.2  --  2.1  --   --   --   PROCALCITON <0.10 <0.10 <0.10  --   --   --     No results for input(s): LIPASE, AMYLASE in the last 168 hours.  Recent Labs  Lab 09/21/20 0301 09/22/20 0243 09/23/20 0300  PROCALCITON <0.10 <0.10 <0.10    ------------------------------------------------------------------------------------------------------------------ No results for input(s): CHOL, HDL, LDLCALC, TRIG, CHOLHDL, LDLDIRECT in the last 72 hours.  Lab Results  Component Value Date   HGBA1C 6.3 (H) 09/17/2020    ------------------------------------------------------------------------------------------------------------------ No results for input(s): TSH, T4TOTAL, T3FREE, THYROIDAB in the last 72 hours.  Invalid input(s): FREET3 ------------------------------------------------------------------------------------------------------------------ No results for input(s): VITAMINB12, FOLATE, FERRITIN, TIBC, IRON, RETICCTPCT in the last 72 hours.  Coagulation profile No results for input(s): INR, PROTIME in the last 168 hours.  No results for input(s): DDIMER in the last 72 hours.  Cardiac Enzymes No results for input(s): CKTOTAL, CKMB, CKMBINDEX, TROPONINI in the last 168 hours.  ------------------------------------------------------------------------------------------------------------------    Component Value Date/Time   BNP 445.0 (H) 09/17/2020 1314     Antibiotics: Anti-infectives (From admission, onward)    Start     Dose/Rate Route Frequency Ordered Stop  09/17/20 1415  levofloxacin (LEVAQUIN) IVPB 500 mg        500 mg 100 mL/hr over 60 Minutes Intravenous  Once 09/17/20 1400 09/17/20 1818        Radiology Reports  No results found.    DVT prophylaxis: Apixaban  Code Status: Full code  Family Communication: discussed with wife at bedside   Consultants: Cardiology  Procedures:     Objective    Physical Examination:  General-appears in no acute distress Heart-S1-S2, regular, no murmur auscultated Lungs-bilateral rhonchi auscultated Abdomen-soft, nontender, no organomegaly Extremities-no edema in the lower extremities Neuro-alert, oriented x3, no focal deficit noted  Status is: Inpatient  Dispo: The patient is from: Home              Anticipated d/c is to: Home              Anticipated d/c date is: 09/29/2020              Patient currently currently not stable for discharge  Barrier to discharge-ongoing treatment for A. fib with RVR  COVID-19  Labs  No results for input(s): DDIMER, FERRITIN, LDH, CRP in the last 72 hours.  Lab Results  Component Value Date   SARSCOV2NAA NEGATIVE 09/17/2020    Microbiology  Recent Results (from the past 240 hour(s))  Resp Panel by RT-PCR (Flu A&B, Covid) Nasopharyngeal Swab     Status: None   Collection Time: 09/17/20  6:00 PM   Specimen: Nasopharyngeal Swab; Nasopharyngeal(NP) swabs in vial transport medium  Result Value Ref Range Status   SARS Coronavirus 2 by RT PCR NEGATIVE NEGATIVE Final    Comment: (NOTE) SARS-CoV-2 target nucleic acids are NOT DETECTED.  The SARS-CoV-2 RNA is generally detectable in upper respiratory specimens during the acute phase of infection. The lowest concentration of SARS-CoV-2 viral copies this assay can detect is 138 copies/mL. A negative result does not preclude SARS-Cov-2 infection and should not be used as the sole basis for treatment or other patient management decisions. A negative result may occur with  improper specimen collection/handling, submission of specimen other than nasopharyngeal swab, presence of viral mutation(s) within the areas targeted by this assay, and inadequate number of viral copies(<138 copies/mL). A negative result must be combined with clinical observations, patient history, and epidemiological information. The expected result is Negative.  Fact Sheet for Patients:  BloggerCourse.com  Fact Sheet for Healthcare Providers:  SeriousBroker.it  This test is no t yet approved or cleared by the [place] FDA and  has been authorized for detection and/or diagnosis of SARS-CoV-2 by FDA under an Emergency Use Authorization (EUA). This EUA will remain  in effect (meaning this test can be used) for the duration of the COVID-19 declaration under Section 564(b)(1) of the Act, 21 U.S.C.section 360bbb-3(b)(1), unless the authorization is terminated  or revoked sooner.        Influenza A by PCR NEGATIVE NEGATIVE Final   Influenza B by PCR NEGATIVE NEGATIVE Final    Comment: (NOTE) The Xpert Xpress SARS-CoV-2/FLU/RSV plus assay is intended as an aid in the diagnosis of influenza from Nasopharyngeal swab specimens and should not be used as a sole basis for treatment. Nasal washings and aspirates are unacceptable for Xpert Xpress SARS-CoV-2/FLU/RSV testing.  Fact Sheet for Patients: BloggerCourse.com  Fact Sheet for Healthcare Providers: SeriousBroker.it  This test is not yet approved or cleared by the [place] FDA and has been authorized for detection and/or diagnosis of SARS-CoV-2 by FDA under an Emergency Use  Authorization (EUA). This EUA will remain in effect (meaning this test can be used) for the duration of the COVID-19 declaration under Section 564(b)(1) of the Act, 21 U.S.C. section 360bbb-3(b)(1), unless the authorization is terminated or revoked.  Performed at [facility], [address], [place], [place] [address]   MRSA Next Gen by PCR, Nasal     Status: Abnormal   Collection Time: 09/18/20 12:17 AM   Specimen: Nasal Mucosa; Nasal Swab  Result Value Ref Range Status   MRSA by PCR Next Gen DETECTED (A) NOT DETECTED Final    Comment: RESULT CALLED TO, READ BACK BY AND VERIFIED WITH: DANIELLE, RN @ [phone] ON 09/18/20 C VARNER (NOTE) The GeneXpert MRSA Assay (FDA approved for NASAL specimens only), is one component of a comprehensive MRSA colonization surveillance program. It is not intended to diagnose MRSA infection nor to guide or monitor treatment for MRSA infections. Test performance is not FDA approved in patients less than 52 years old. Performed at [facility], [address], [place], [place] [address]          Meredeth Ide   [facility] If 7PM-7AM, please contact night-coverage at [email or web address], Office  [phone]   09/27/2020, 12:40 PM   LOS: 10 days

## 2020-09-27 NOTE — Progress Notes (Signed)
RN called, that patient wants to  be DNR.Discussed with patient and his wife at bedside. He does not want intubation, mechanical ventilation, no CPR. Will make him DNR

## 2020-09-27 NOTE — Progress Notes (Signed)
Progress Note  Patient Name: Dalton Juarez Date of Encounter: 09/27/2020  [facility] Cardiologist: Thurmon Fair, MD     Patient Profile     75 y.o. male with a history of CAD with prior stenting on DAPT ischemic cardiomyopathy/chronic systolic CHF, paroxysmal atrial fibrillation not on anticoagulation at home, s/p VATS decortication with mechanical pleurodesis at [facility], COPD on home O2, hypertension, hyperlipidemia, type 2 diabetes, GERD, and prior tobacco use (quit 4 years ago) admitted for shortness of breath progressive over months and was found to be in atrial fibrillation with a rapid rate.  Troponins were negative treated with BiPAP. CXR>> patchy infiltrates>>abx Heparin started>>,Apixoban  Amiodarone (IV) started Echo EF 40-45% Pulm eval 8/20 >> see note  Subjective  Much less short of breath this morning; on BiPAP No chest pain  Discussion regarding Dr. Laurena Spies impressions and concerns regarding   intubation  Inpatient Medications    Scheduled Meds:  apixaban  5 mg Oral BID   atorvastatin  40 mg Oral Daily   chlorhexidine  15 mL Mouth Rinse BID   Chlorhexidine Gluconate Cloth  6 each Topical Daily   cholecalciferol  2,000 Units Oral Daily   clopidogrel  75 mg Oral Daily   dorzolamide  1 drop Left Eye BID   furosemide  80 mg Intravenous TID   gabapentin  300 mg Oral TID   guaiFENesin  1,200 mg Oral BID   hydrocortisone  25 mg Rectal BID   insulin aspart  0-15 Units Subcutaneous TID WC   insulin aspart  0-5 Units Subcutaneous QHS   mouth rinse  15 mL Mouth Rinse q12n4p   metoprolol tartrate  50 mg Oral QID   mometasone-formoterol  2 puff Inhalation BID   multivitamin with minerals  1 tablet Oral Daily   pantoprazole  40 mg Oral Daily   phenylephrine  1 suppository Rectal BID   polyethylene glycol  17 g Oral BID   predniSONE  40 mg Oral Q breakfast   revefenacin  175 mcg Nebulization Daily   senna-docusate  1 tablet Oral BID   tamsulosin  0.4 mg Oral  Daily   Continuous Infusions:  sodium chloride Stopped (09/20/20 2035)   amiodarone 30 mg/hr (09/27/20 0500)   PRN Meds: sodium chloride, acetaminophen **OR** acetaminophen, guaiFENesin-dextromethorphan, ipratropium, metoprolol tartrate, nitroGLYCERIN, witch hazel-glycerin   Vital Signs    Vitals:   09/27/20 0305 09/27/20 0400 09/27/20 0644 09/27/20 0737  BP:  127/70 (!) 123/109   Pulse: (!) 55 88 95 99  Resp: (!) 25 16 20  (!) 21  Temp:  98.8 F (37.1 C)    TempSrc:  Axillary    SpO2: 99% 98% 98% 98%  Weight:  100 kg    Height:        Intake/Output Summary (Last 24 hours) at 09/27/2020 0756 Last data filed at 09/27/2020 0500 Gross per 24 hour  Intake 818.23 ml  Output 2706 ml  Net -1887.77 ml    Last 3 Weights 09/27/2020 09/26/2020 09/25/2020  Weight (lbs) 220 lb 7.4 oz 223 lb 1.7 oz 224 lb 6.9 oz  Weight (kg) 100 kg 101.2 kg 101.8 kg      Telemetry   Telemetry Personally reviewed atrial fibrillation with heart rates in the 90-110 range  ECG    No new tracing - Personally Reviewed  Physical Exam   Well developed and nourished in less respiratory distress on BiPAP HENT normal Neck supple   Wheezing Irregularly irregular rate and rhythm, no murmurs or  gallops Abd-soft with active BS No Clubbing cyanosis edema Skin-warm and dry A & Oriented  Grossly normal sensory and motor function         Labs    High Sensitivity Troponin:   Recent Labs  Lab 09/17/20 1314 09/17/20 1522  TROPONINIHS 35* 31*       Chemistry Recent Labs  Lab 09/24/20 0244 09/26/20 0242 09/27/20 0306  NA 141 142 139  K 3.9 3.5 4.7  CL 92* 89* 88*  CO2 38* 40* 41*  GLUCOSE 135* 141* 129*  BUN 48* 44* 41*  CREATININE 1.25* 1.17 1.13  CALCIUM 8.7* 8.8* 9.0  GFRNONAA 59* >60 >60  ANIONGAP 11 13 10       Hematology Recent Labs  Lab 09/24/20 0244 09/26/20 0242 09/27/20 0306  WBC 17.6* 17.7* 16.1*  RBC 4.44 4.34 4.48  HGB 12.2* 11.8* 12.3*  HCT 39.6 38.6* 40.2  MCV  89.2 88.9 89.7  MCH 27.5 27.2 27.5  MCHC 30.8 30.6 30.6  RDW 14.0 13.8 14.0  PLT 305 334 224     BNPNo results for input(s): BNP, PROBNP in the last 168 hours.   DDimer No results for input(s): DDIMER in the last 168 hours.   Radiology    CT HEAD CODE STROKE WO CONTRAST  Result Date: 09/25/2020 CLINICAL DATA:  Code stroke. Acute onset left pupil change, right facial droop EXAM: CT HEAD WITHOUT CONTRAST TECHNIQUE: Contiguous axial images were obtained from the base of the skull through the vertex without intravenous contrast. COMPARISON:  None. FINDINGS: Brain: There is no evidence of acute intracranial hemorrhage, extra-axial fluid collection, or infarct. There is mild parenchymal volume loss with commensurate enlargement of the ventricular system. Foci of hypodensity in the subcortical and periventricular white matter likely reflect mild chronic white matter microangiopathy. There is no mass lesion. There is no midline shift. Vascular: There is calcification of the bilateral cavernous ICAs. Skull: Normal. Negative for fracture or focal lesion. Sinuses/Orbits: The imaged paranasal sinuses are clear. The patient is status post right lens implant. The globes and orbits are otherwise unremarkable. Other: There is a remote right nasal bone fracture. ASPECTS [facility] Stroke Program Early CT Score) - Ganglionic level infarction (caudate, lentiform nuclei, internal capsule, insula, M1-M3 cortex): 3 - Supraganglionic infarction (M4-M6 cortex): 7 Total score (0-10 with 10 being normal): 10 IMPRESSION: 1. No acute intracranial pathology 2. ASPECTS is 10 3. Mild parenchymal volume loss and chronic white matter microangiopathy. These results were called by telephone at the time of interpretation on 09/25/2020 at 10:08 am to provider [facility] , who verbally acknowledged these results. Electronically Signed   By: [facility] M.D.   On: 09/25/2020 10:09    Cardiac Studies   Echocardiogram  09/18/2020: Impressions:  1. Left ventricular ejection fraction by 3D volume is 49 %. The left  ventricle has mildly decreased function. The left ventricle has no  regional wall motion abnormalities. There is mild left ventricular  hypertrophy. Left ventricular diastolic parameters   are indeterminate.   2. Right ventricular systolic function is normal. The right ventricular  size is normal. There is mildly elevated pulmonary artery systolic  pressure. The estimated right ventricular systolic pressure is 40.5 mmHg.   3. Right atrial size was moderately dilated.   4. The mitral valve is normal in structure. Moderate mitral valve  regurgitation. No evidence of mitral stenosis.   5. The aortic valve is normal in structure. Aortic valve regurgitation is  not visualized. Mild to moderate aortic  valve sclerosis/calcification is  present, without any evidence of aortic stenosis.   6. The inferior vena cava is normal in size with greater than 50%   respiratory variability, suggesting right atrial pressure of 3 mmHg.    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  Clearly difficult situation.  Atrial fibrillation most certainly contributing to respiratory insufficiency but the options to restore sinus rhythm are challenging.  Pulmonary concerns about anesthesia for TEE seems to be to be precluding.  2 alternatives present themselves, the first is to try to hold off for another 10 days or so and then doing very rapidly not needing TEE, the second is to understand that thromboembolic risks would be increased but undertake DCCV without antecedent TEE.  I have reviewed both of these issues with him and his wife.  In the event that his condition stabilizes obviously  holding off on DCCV would be preferred.  His bedrest and his weakness will only however continue to worsen all of which make disposition decisions much more challenging.  For now, continue amio, but will switch metop to bisoprolol for greater selectivity And add low dose dilt ( for now)    Continue Apixoban    Needs ongoing respiratory care-- wheezing a lot this am  Still very alkalemic   will defer to primary and pulmonary services ? Diamox?      Sherryl Manges, MD

## 2020-09-28 DIAGNOSIS — R778 Other specified abnormalities of plasma proteins: Secondary | ICD-10-CM

## 2020-09-28 DIAGNOSIS — N179 Acute kidney failure, unspecified: Secondary | ICD-10-CM | POA: Diagnosis not present

## 2020-09-28 DIAGNOSIS — I5043 Acute on chronic combined systolic (congestive) and diastolic (congestive) heart failure: Secondary | ICD-10-CM | POA: Diagnosis not present

## 2020-09-28 DIAGNOSIS — J9601 Acute respiratory failure with hypoxia: Secondary | ICD-10-CM

## 2020-09-28 DIAGNOSIS — I10 Essential (primary) hypertension: Secondary | ICD-10-CM

## 2020-09-28 DIAGNOSIS — I4891 Unspecified atrial fibrillation: Secondary | ICD-10-CM | POA: Diagnosis not present

## 2020-09-28 DIAGNOSIS — I5041 Acute combined systolic (congestive) and diastolic (congestive) heart failure: Secondary | ICD-10-CM

## 2020-09-28 DIAGNOSIS — J441 Chronic obstructive pulmonary disease with (acute) exacerbation: Secondary | ICD-10-CM | POA: Diagnosis not present

## 2020-09-28 LAB — BASIC METABOLIC PANEL
Anion gap: 12 (ref 5–15)
BUN: 39 mg/dL — ABNORMAL HIGH (ref 8–23)
CO2: 39 mmol/L — ABNORMAL HIGH (ref 22–32)
Calcium: 9.2 mg/dL (ref 8.9–10.3)
Chloride: 90 mmol/L — ABNORMAL LOW (ref 98–111)
Creatinine, Ser: 1.12 mg/dL (ref 0.61–1.24)
GFR, Estimated: >60 mL/min (ref >60)
Glucose, Bld: 141 mg/dL — ABNORMAL HIGH (ref 70–99)
Potassium: 3.7 mmol/L (ref 3.5–5.1)
Sodium: 141 mmol/L (ref 135–145)

## 2020-09-28 LAB — GLUCOSE, CAPILLARY
Glucose-Capillary: 113 mg/dL — ABNORMAL HIGH (ref 70–99)
Glucose-Capillary: 150 mg/dL — ABNORMAL HIGH (ref 70–99)
Glucose-Capillary: 144 mg/dL — ABNORMAL HIGH (ref 70–99)
Glucose-Capillary: 165 mg/dL — ABNORMAL HIGH (ref 70–99)

## 2020-09-28 MED ORDER — AMIODARONE HCL 200 MG PO TABS
400.0000 mg | ORAL_TABLET | Freq: Two times a day (BID) | ORAL | Status: DC
  Administered 2020-09-28 – 2020-10-01 (×8): 400 mg via ORAL
  Filled 2020-09-28 (×8): qty 2

## 2020-09-28 MED ORDER — AMIODARONE HCL 200 MG PO TABS: 200.0000 mg | ORAL_TABLET | Freq: Every day | ORAL | Status: DC

## 2020-09-28 MED ORDER — AMIODARONE HCL 200 MG PO TABS: 200.0000 mg | ORAL_TABLET | Freq: Two times a day (BID) | ORAL | Status: DC

## 2020-09-28 NOTE — Progress Notes (Signed)
PT refused CPT for 0800 rounds.

## 2020-09-28 NOTE — Progress Notes (Signed)
Progress Note  Patient Name: Dalton Juarez Date of Encounter: 09/28/2020  [facility] Cardiologist: Thurmon Fair, MD   Subjective   Denies any chest pain or SOB.  Feels weak.  Remains in atrial fibrillation with CVR.  Inpatient Medications    Scheduled Meds:  apixaban  5 mg Oral BID   atorvastatin  40 mg Oral Daily   bisoprolol  5 mg Oral BID   chlorhexidine  15 mL Mouth Rinse BID   Chlorhexidine Gluconate Cloth  6 each Topical Daily   cholecalciferol  2,000 Units Oral Daily   clopidogrel  75 mg Oral Daily   diltiazem  30 mg Oral Q6H   dorzolamide  1 drop Left Eye BID   furosemide  80 mg Intravenous Q12H   gabapentin  300 mg Oral TID   hydrocortisone  25 mg Rectal BID   insulin aspart  0-15 Units Subcutaneous TID WC   insulin aspart  0-5 Units Subcutaneous QHS   mouth rinse  15 mL Mouth Rinse q12n4p   mometasone-formoterol  2 puff Inhalation BID   multivitamin with minerals  1 tablet Oral Daily   pantoprazole  40 mg Oral Daily   phenazopyridine  100 mg Oral TID WC   phenylephrine  1 suppository Rectal BID   polyethylene glycol  17 g Oral BID   revefenacin  175 mcg Nebulization Daily   senna-docusate  1 tablet Oral BID   tamsulosin  0.4 mg Oral Daily   Continuous Infusions:  sodium chloride Stopped (09/20/20 2035)   amiodarone 30 mg/hr (09/28/20 0842)   PRN Meds: sodium chloride, acetaminophen **OR** acetaminophen, guaiFENesin-dextromethorphan, ipratropium, metoprolol tartrate, nitroGLYCERIN, witch hazel-glycerin   Vital Signs    Vitals:   09/28/20 0836 09/28/20 0900 09/28/20 1000 09/28/20 1100  BP:  111/61 (!) 123/54 118/65  Pulse:  89 (!) 103 (!) 102  Resp:  17 (!) 22 20  Temp:      TempSrc:      SpO2: 95% 100% 94% 95%  Weight:      Height:        Intake/Output Summary (Last 24 hours) at 09/28/2020 1243 Last data filed at 09/28/2020 1103 Gross per 24 hour  Intake 344.62 ml  Output 3800 ml  Net -3455.38 ml   Last 3 Weights 09/28/2020 09/27/2020  09/26/2020  Weight (lbs) 220 lb 3.8 oz 220 lb 7.4 oz 223 lb 1.7 oz  Weight (kg) 99.9 kg 100 kg 101.2 kg      Telemetry    Atrial fibrillation with CVR - Personally Reviewed  ECG    No new EKG to review - Personally Reviewed  Physical Exam   GEN: No acute distress.   Neck: No JVD Cardiac: irregularly irregular, no murmurs, rubs, or gallops.  Respiratory: Clear to auscultation bilaterally. GI: Soft, nontender, non-distended  MS: trace pedal edema in the ankles posteriorly; No deformity. Neuro:  Nonfocal  Psych: Normal affect   Labs    High Sensitivity Troponin:   Recent Labs  Lab 09/17/20 1314 09/17/20 1522  TROPONINIHS 35* 31*      Chemistry Recent Labs  Lab 09/26/20 0242 09/27/20 0306 09/28/20 0305  NA 142 139 141  K 3.5 4.7 3.7  CL 89* 88* 90*  CO2 40* 41* 39*  GLUCOSE 141* 129* 141*  BUN 44* 41* 39*  CREATININE 1.17 1.13 1.12  CALCIUM 8.8* 9.0 9.2  GFRNONAA >60 >60 >60  ANIONGAP 13 10 12      Hematology Recent Labs  Lab 09/24/20 0244  09/26/20 0242 09/27/20 0306  WBC 17.6* 17.7* 16.1*  RBC 4.44 4.34 4.48  HGB 12.2* 11.8* 12.3*  HCT 39.6 38.6* 40.2  MCV 89.2 88.9 89.7  MCH 27.5 27.2 27.5  MCHC 30.8 30.6 30.6  RDW 14.0 13.8 14.0  PLT 305 334 224    BNPNo results for input(s): BNP, PROBNP in the last 168 hours.   DDimer No results for input(s): DDIMER in the last 168 hours.   CHA2DS2-VASc Score = 6  This indicates a 9.7% annual risk of stroke. The patient's score is based upon: CHF History: Yes HTN History: Yes Diabetes History: Yes Stroke History: No Vascular Disease History: Yes Age Score: 2 Gender Score: 0   Radiology    No results found.  Cardiac Studies   2D echo 09/18/2020 IMPRESSIONS    1. Left ventricular ejection fraction by 3D volume is 49 %. The left  ventricle has mildly decreased function. The left ventricle has no  regional wall motion abnormalities. There is mild left ventricular  hypertrophy. Left ventricular  diastolic parameters   are indeterminate.   2. Right ventricular systolic function is normal. The right ventricular  size is normal. There is mildly elevated pulmonary artery systolic  pressure. The estimated right ventricular systolic pressure is 40.5 mmHg.   3. Right atrial size was moderately dilated.   4. The mitral valve is normal in structure. Moderate mitral valve  regurgitation. No evidence of mitral stenosis.   5. The aortic valve is normal in structure. Aortic valve regurgitation is  not visualized. Mild to moderate aortic valve sclerosis/calcification is  present, without any evidence of aortic stenosis.   6. The inferior vena cava is normal in size with greater than 50%  respiratory variability, suggesting right atrial pressure of 3 mmHg.   Patient Profile     75 y.o. male with a history of CAD with prior stenting on DAPT ischemic cardiomyopathy/chronic systolic CHF, paroxysmal atrial fibrillation not on anticoagulation at home, s/p VATS decortication with mechanical pleurodesis at [facility], COPD on home O2, hypertension, hyperlipidemia, type 2 diabetes, GERD, and prior tobacco use (quit 4 years ago) admitted for shortness of breath progressive over months and was found to be in atrial fibrillation with a rapid rate.   Assessment & Plan    PAF/Afib with RVR -admitted with acute respiratory failure and found to be in atrial fibrillation  -CHADS2VASC score is 6 -started on Apixaban 5mg  BID -felt PAF was secondary to acute respiratory failure and there were concerns regarding anesthesia for TEE/DCCV due to his underlying pulmonary problems. -plan established over the weekend was to try to hold off on TEE/DCCV and if HR controlled then plan DCCV in about 3 weeks from going on Eliquis -Heart rate is adequately controlled today -continue Bisoprolol 5mg  BID and Eliquis 5mg  BID -stop Cardizem due to soft BP (he has not had any doses since 5am today due to soft BPs and HR is controlled on  BB and Amio) -change Amio to 400mg  BID PO x 1 week then 200mg  BID for 1 week and then 200mg  daily  Acute Respiratory Failure/COPD exacerbation -patient has hx of O2 dependent COPD -dx with COPD exacerbation and now on antibx per TR[facility] -Acute on chronic CHF likely contributing to respiratory failure as well  Acute on chronic combined systolic/diastolic CHF -BNP elevated at -Echo with EF 49% -IV Lasix decreased to 80mg  IV BID -He put out 3.1L and net neg 16.6L -weight down 10lbs from admit -SCr stable at 1.12 -  he still has some mild ankle edema>>add compression hose -Hold further Lasix today due to soft BP and also stopping Cardizem -reassess fluid status in the am -Diamox given x 2 doses for elevated CO2 which is now down from 41 to 39 (suspect this is a chronic metabolic alkalosis compensated for his 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 -follow daily weights, I&O's and renal function -continue BB (needed for HR control) -BP too soft for ACEI/ARB/ARNi or MRN -consider addition of Farxiga 10mg  daily if no contraindications per [facility]  Elevated Troponin -minimally elevated at 35>32 -2D echo with very mild LV dysfunction and no focal wall motion abnormalities -likely demand ischemia in the setting of afib with RVR and acute hypoxemic respiratory failure -no further ischemic workup at this time -He is on DAPT at home with ASA and Plavix -his ASA has been stopped due to starting Eliquis  AKI -SCr bumped to 1.25 In setting of diuresis -improved today to 1.12 -will hold further Lasix for now and reassess in am given his soft BP as well   I have spent a total of 35 minutes with patient reviewing hospital notes , telemetry, EKGs, labs and examining patient as well as establishing an assessment and plan that was discussed with the patient.  > 50% of time was spent in direct patient care.       For questions or updates, please contact [facility] Please consult [email or web address] for  contact info under        Signed, , MD  09/28/2020, 12:43 PM

## 2020-09-28 NOTE — Progress Notes (Addendum)
PT Cancellation Note  Patient Details Name: Dalton Juarez MRN: [id number] DOB: 04/17/1945   Cancelled Treatment:    Reason Eval/Treat Not Completed: Medical issues which prohibited therapy   Rada Hay 09/28/2020, 4:11 PM Blanchard Kelch PT Acute Rehabilitation Services Pager [phone] Office [phone]

## 2020-09-28 NOTE — Progress Notes (Addendum)
[facility]  PROGRESS NOTE  Dalton Juarez JHE:[id number] DOB: April 06, 1945 DOA: 09/17/2020 PCP: Smith Robert, MD   Brief HPI:   75 year old male with history of COPD on 2 L/min of oxygen via nasal cannula, diabetes mellitus type 2, CHF/ischemic cardiomyopathy, atrial fibrillation presented to ED with 2 to 3 weeks history of progressively worsening shortness of breath, 10 pound weight gain. In the ED he was found to be tachycardic, with A. fib with RVR.  Cardizem drip was started.  Chest x-ray showed bilateral patchy opacity was placed on Levaquin.  Cardiology was consulted    Subjective   Denies dyspnea, bladder spasms.  Was complaining of bladder spasms since putting Foley catheter.  Started on Pyridium for 3 doses.  Bladder spasms have resolved.   Assessment/Plan:    Acute on chronic combined systolic and diastolic CHF -Presented with BNP of 445; echo shows EF 49% -Started on Lasix 80 mg IV 3 times daily -Net -16 liters -Continue to monitor intake and output -CO2 elevated 41; was given  2 doses of Diamox 250 mg p.o. twice daily -Lasix is currently on hold per cardiology due to soft BP -Okay to start [place]   Atrial fibrillation with RVR -Heart rate is controlled -Started on Lopressor 50 mg 3 times daily, switch to bisoprolol 5 mg p.o. twice daily  -Cardizem was stopped by cardiology -Transitioned to p.o. amiodarone from  IV amiodarone -TSH is normal -Started on Eliquis for anticoagulation -Cardiology was  contemplating TEE guided cardioversion, pulmonology was consulted.  He does not feel that patient can undergo cardioversion under sedation and have to be put under general anesthesia  with intubation  Positive troponin -Mild elevation of troponin.  35>> 32 -Likely demand ischemia -No ischemic work-up planned per cardiology -He was on DAPT with aspirin and Plavix at home -Aspirin stopped as patient is on Eliquis  Hypertension -Blood pressure is stable  COPD  exacerbation -Continue DuoNeb nebulizer, Solu-Medrol -Add flutter valve every 4 hours -Continue Mucinex, prednisone 40 mg daily  Acute kidney injury -In setting of diuresis; creatinine is 1.25 -We will closely monitor patient's creatinine  Diabetes mellitus type 2 -Continue sliding scale insulin with NovoLog -CBG well controlled   Hemorrhoids -Anusol HC suppository twice a day  Constipation -No improvement with Dulcolax suppository -We will give milk and molasses enema  Urinary retention -Started on Flomax 0.4 mg daily -Had multiple episodes requiring in and out cath -Foley catheter inserted on 09/27/2020  Leukocytosis -WBC has been elevated;afebrile, no signs of infection -Likely from steroids  ?  Right-sided facial droop -CT head was negative for stroke -No focal neurological deficit -Appearance of right-sided facial droop could have been due to mask of BiPAP -Resolved  Goals of care -Discussed with patient's wife and patient -They want to discuss with palliative care regarding goals of care and possible hospice option -We will consult palliative care for goals of care discussion  Scheduled medications:    apixaban  5 mg Oral BID   atorvastatin  40 mg Oral Daily   bisoprolol  5 mg Oral BID   chlorhexidine  15 mL Mouth Rinse BID   Chlorhexidine Gluconate Cloth  6 each Topical Daily   cholecalciferol  2,000 Units Oral Daily   clopidogrel  75 mg Oral Daily   diltiazem  30 mg Oral Q6H   dorzolamide  1 drop Left Eye BID   furosemide  80 mg Intravenous Q12H   gabapentin  300 mg Oral TID   guaiFENesin  1,200 mg  Oral BID   hydrocortisone  25 mg Rectal BID   insulin aspart  0-15 Units Subcutaneous TID WC   insulin aspart  0-5 Units Subcutaneous QHS   mouth rinse  15 mL Mouth Rinse q12n4p   mometasone-formoterol  2 puff Inhalation BID   multivitamin with minerals  1 tablet Oral Daily   pantoprazole  40 mg Oral Daily   phenazopyridine  100 mg Oral TID WC    phenylephrine  1 suppository Rectal BID   polyethylene glycol  17 g Oral BID   revefenacin  175 mcg Nebulization Daily   senna-docusate  1 tablet Oral BID   tamsulosin  0.4 mg Oral Daily         Data Reviewed:   CBG:  Recent Labs  Lab 09/27/20 0814 09/27/20 1158 09/27/20 1642 09/27/20 2140 09/28/20 0736  GLUCAP 119* 157* 144* 168* 113*    SpO2: 93 % O2 Flow Rate (L/min): 4 L/min FiO2 (%): 30 %    Vitals:   09/28/20 0700 09/28/20 0705 09/28/20 0803 09/28/20 0804  BP: 100/62   113/70  Pulse: (!) 34 87 80 87  Resp: 18  20 17   Temp:      TempSrc:      SpO2: 97%  96% 93%  Weight:      Height:         Intake/Output Summary (Last 24 hours) at 09/28/2020 0820 Last data filed at 09/28/2020 0507 Gross per 24 hour  Intake 402.23 ml  Output 2850 ml  Net -2447.77 ml    08/20 1901 - 08/22 0700 In: 592.5 [I.V.:592.5] Out: 4005 [Urine:4005]  Filed Weights   09/26/20 0500 09/27/20 0400 09/28/20 0500  Weight: 101.2 kg 100 kg 99.9 kg    CBC:  Recent Labs  Lab 09/22/20 0243 09/23/20 0300 09/24/20 0244 09/26/20 0242 09/27/20 0306  WBC 16.2* 14.5* 17.6* 17.7* 16.1*  HGB 12.3* 11.6* 12.2* 11.8* 12.3*  HCT 40.1 37.8* 39.6 38.6* 40.2  PLT 318 285 305 334 224  MCV 90.1 89.4 89.2 88.9 89.7  MCH 27.6 27.4 27.5 27.2 27.5  MCHC 30.7 30.7 30.8 30.6 30.6  RDW 13.9 13.9 14.0 13.8 14.0    Complete metabolic panel:  Recent Labs  Lab 09/22/20 0243 09/23/20 0300 09/24/20 0244 09/26/20 0242 09/27/20 0306 09/28/20 0305  NA 136 141 141 142 139 141  K 4.8 4.1 3.9 3.5 4.7 3.7  CL 95* 96* 92* 89* 88* 90*  CO2 31 34* 38* 40* 41* 39*  GLUCOSE 152* 141* 135* 141* 129* 141*  BUN 54* 54* 48* 44* 41* 39*  CREATININE 1.50* 1.37* 1.25* 1.17 1.13 1.12  CALCIUM 8.8* 8.6* 8.7* 8.8* 9.0 9.2  MG  --  2.1  --   --   --   --   PROCALCITON <0.10 <0.10  --   --   --   --     No results for input(s): LIPASE, AMYLASE in the last 168 hours.  Recent Labs  Lab 09/22/20 0243  09/23/20 0300  PROCALCITON <0.10 <0.10    ------------------------------------------------------------------------------------------------------------------ No results for input(s): CHOL, HDL, LDLCALC, TRIG, CHOLHDL, LDLDIRECT in the last 72 hours.  Lab Results  Component Value Date   HGBA1C 6.3 (H) 09/17/2020   ------------------------------------------------------------------------------------------------------------------ No results for input(s): TSH, T4TOTAL, T3FREE, THYROIDAB in the last 72 hours.  Invalid input(s): FREET3 ------------------------------------------------------------------------------------------------------------------ No results for input(s): VITAMINB12, FOLATE, FERRITIN, TIBC, IRON, RETICCTPCT in the last 72 hours.  Coagulation profile No results for input(s): INR, PROTIME in the  last 168 hours.  No results for input(s): DDIMER in the last 72 hours.  Cardiac Enzymes No results for input(s): CKTOTAL, CKMB, CKMBINDEX, TROPONINI in the last 168 hours.  ------------------------------------------------------------------------------------------------------------------    Component Value Date/Time   BNP 445.0 (H) 09/17/2020 1314     Antibiotics: Anti-infectives (From admission, onward)    Start     Dose/Rate Route Frequency Ordered Stop   09/17/20 1415  levofloxacin (LEVAQUIN) IVPB 500 mg        500 mg 100 mL/hr over 60 Minutes Intravenous  Once 09/17/20 1400 09/17/20 1818        Radiology Reports  No results found.    DVT prophylaxis: Apixaban  Code Status: Full code  Family Communication: discussed with wife at bedside   Consultants: Cardiology  Procedures:     Objective    Physical Examination:  General-appears in no acute distress Heart-S1-S2, regular, no murmur auscultated Lungs-decreased breath sounds bilaterally Abdomen-soft, nontender, no organomegaly Extremities-trace edema in the lower extremities Neuro-alert,  oriented x3, no focal deficit noted  Status is: Inpatient  Dispo: The patient is from: Home              Anticipated d/c is to: Home              Anticipated d/c date is: 09/29/2020              Patient currently currently not stable for discharge  Barrier to discharge-ongoing treatment for A. fib with RVR  COVID-19 Labs  No results for input(s): DDIMER, FERRITIN, LDH, CRP in the last 72 hours.  Lab Results  Component Value Date   SARSCOV2NAA NEGATIVE 09/17/2020    Microbiology  No results found for this or any previous visit (from the past 240 hour(s)).        Meredeth Ide   [facility] If 7PM-7AM, please contact night-coverage at [email or web address], Office  [phone]   09/28/2020, 8:20 AM  LOS: 11 days

## 2020-09-28 NOTE — Progress Notes (Signed)
Afternoon dose of PO cardizem held due to Sbp in 90's. Dr. Mayford Knife notified by RN, see new orders. This RN will continue to carefully monitor pt.

## 2020-09-28 NOTE — Plan of Care (Signed)

## 2020-09-29 ENCOUNTER — Telehealth: Payer: Self-pay | Admitting: Cardiovascular Disease

## 2020-09-29 DIAGNOSIS — Z7189 Other specified counseling: Secondary | ICD-10-CM

## 2020-09-29 DIAGNOSIS — N179 Acute kidney failure, unspecified: Secondary | ICD-10-CM | POA: Diagnosis not present

## 2020-09-29 DIAGNOSIS — Z515 Encounter for palliative care: Secondary | ICD-10-CM

## 2020-09-29 DIAGNOSIS — I5043 Acute on chronic combined systolic (congestive) and diastolic (congestive) heart failure: Secondary | ICD-10-CM | POA: Diagnosis not present

## 2020-09-29 DIAGNOSIS — R778 Other specified abnormalities of plasma proteins: Secondary | ICD-10-CM | POA: Diagnosis not present

## 2020-09-29 DIAGNOSIS — I10 Essential (primary) hypertension: Secondary | ICD-10-CM | POA: Diagnosis not present

## 2020-09-29 DIAGNOSIS — J441 Chronic obstructive pulmonary disease with (acute) exacerbation: Secondary | ICD-10-CM | POA: Diagnosis not present

## 2020-09-29 DIAGNOSIS — R059 Cough, unspecified: Secondary | ICD-10-CM

## 2020-09-29 DIAGNOSIS — I4891 Unspecified atrial fibrillation: Secondary | ICD-10-CM | POA: Diagnosis not present

## 2020-09-29 LAB — CBC
HCT: 39 % (ref 39.0–52.0)
Hemoglobin: 12.1 g/dL — ABNORMAL LOW (ref 13.0–17.0)
MCH: 27.4 pg (ref 26.0–34.0)
MCHC: 31 g/dL (ref 30.0–36.0)
MCV: 88.4 fL (ref 80.0–100.0)
Platelets: 304 10*3/uL (ref 150–400)
RBC: 4.41 MIL/uL (ref 4.22–5.81)
RDW: 13.9 % (ref 11.5–15.5)
WBC: 14.9 10*3/uL — ABNORMAL HIGH (ref 4.0–10.5)
nRBC: 0 % (ref 0.0–0.2)

## 2020-09-29 LAB — BASIC METABOLIC PANEL
Anion gap: 12 (ref 5–15)
BUN: 42 mg/dL — ABNORMAL HIGH (ref 8–23)
CO2: 37 mmol/L — ABNORMAL HIGH (ref 22–32)
Calcium: 9.1 mg/dL (ref 8.9–10.3)
Chloride: 89 mmol/L — ABNORMAL LOW (ref 98–111)
Creatinine, Ser: 1.18 mg/dL (ref 0.61–1.24)
GFR, Estimated: >60 mL/min (ref >60)
Glucose, Bld: 114 mg/dL — ABNORMAL HIGH (ref 70–99)
Potassium: 3.1 mmol/L — ABNORMAL LOW (ref 3.5–5.1)
Sodium: 138 mmol/L (ref 135–145)

## 2020-09-29 LAB — GLUCOSE, CAPILLARY
Glucose-Capillary: 102 mg/dL — ABNORMAL HIGH (ref 70–99)
Glucose-Capillary: 124 mg/dL — ABNORMAL HIGH (ref 70–99)
Glucose-Capillary: 129 mg/dL — ABNORMAL HIGH (ref 70–99)
Glucose-Capillary: 182 mg/dL — ABNORMAL HIGH (ref 70–99)

## 2020-09-29 MED ORDER — FUROSEMIDE 20 MG PO TABS
20.0000 mg | ORAL_TABLET | Freq: Every day | ORAL | Status: DC
  Administered 2020-09-29 – 2020-10-01 (×3): 20 mg via ORAL
  Filled 2020-09-29 (×3): qty 1

## 2020-09-29 MED ORDER — ACETYLCYSTEINE 20 % IN SOLN
4.0000 mL | RESPIRATORY_TRACT | Status: AC
  Administered 2020-09-29 (×3): 4 mL via RESPIRATORY_TRACT
  Filled 2020-09-29 (×5): qty 4

## 2020-09-29 MED ORDER — POTASSIUM CHLORIDE 10 MEQ/100ML IV SOLN
10.0000 meq | INTRAVENOUS | Status: AC
  Administered 2020-09-29 (×3): 10 meq via INTRAVENOUS
  Filled 2020-09-29 (×3): qty 100

## 2020-09-29 MED ORDER — ALBUTEROL SULFATE (2.5 MG/3ML) 0.083% IN NEBU
2.5000 mg | INHALATION_SOLUTION | RESPIRATORY_TRACT | Status: DC | PRN
  Administered 2020-09-29 (×3): 2.5 mg via RESPIRATORY_TRACT
  Filled 2020-09-29 (×2): qty 3

## 2020-09-29 MED ORDER — POTASSIUM CHLORIDE CRYS ER 20 MEQ PO TBCR: 40.0000 meq | EXTENDED_RELEASE_TABLET | Freq: Once | ORAL | Status: DC

## 2020-09-29 NOTE — Telephone Encounter (Signed)
Currently admitted to hospital since 09/17/20

## 2020-09-29 NOTE — Telephone Encounter (Signed)
Patient is scheduled for 09/08 at 10:00am with Gillian Shields for a TOC appt per [facility].

## 2020-09-29 NOTE — Progress Notes (Signed)
Occupational Therapy Treatment Patient Details Name: Dalton Juarez MRN: [id number] DOB: 1945/07/05 Today's Date: 09/29/2020    History of present illness 75 year old male with history of COPD on 2 L/min of oxygen, diabetes mellitus type 2, CHF/ischemic cardiomyopathy, atrial fibrillation presented to ED 09/17/20 with worsening shortness of breath, 10 pound weight gain.  In the ED he was found to be tachycardic, with A. fib with RVR.   Chest x-ray showed bilateral patchy opacity   OT comments  Patient attempted sitting on edge of bed with dizziness onset with change in positioning. Patient's dizziness did not dissipate with increased time sitting up on edge of bed. Patient was transitioned back to bed at this time. Patient's discharge plan might need to be reassessed pending next session in OT. OT will continue to follow acutely.    Follow Up Recommendations  Home health OT    Equipment Recommendations  None recommended by OT    Recommendations for Other Services      Precautions / Restrictions Precautions Precautions: Fall Precaution Comments: monitor HR and SATS Restrictions Weight Bearing Restrictions: No       Mobility Bed Mobility Overal bed mobility: Needs Assistance Bed Mobility: Supine to Sit     Supine to sit: HOB elevated;Min guard     General bed mobility comments: patient required min guard for supine to sit on edge of bed with HOB elevated with education for sequencing provided.    Transfers                      Balance Overall balance assessment: Needs assistance Sitting-balance support: Feet supported;Bilateral upper extremity supported   Sitting balance - Comments: patient required mod A to maintain sitting balance on edge of bed with lateral lean to L side. patient reported increased dizziness.                                   ADL either performed or assessed with clinical judgement   ADL                                          General ADL Comments: patient attempted sitting up on edge of bed with goal of getting out of bed. patient was noted to become increasingly dizzy with movement and was retunred back to bed at this time.     Vision Patient Visual Report: No change from baseline     Perception     Praxis      Cognition Arousal/Alertness: Awake/alert Behavior During Therapy: WFL for tasks assessed/performed Overall Cognitive Status: Within Functional Limits for tasks assessed                                          Exercises     Shoulder Instructions       General Comments patients blood pressure at start of session was 82/63 mmhg supine in bed. sitting on edge of bed patients blood pressure was 91/57 mmhg. patient reported increasing dizziness sitting edge of bed that was not subsiding with increased time sitting. patient was returned to supine in bed with all needs within reach.    Pertinent Vitals/ Pain       Pain Assessment:  No/denies pain  Home Living                                          Prior Functioning/Environment              Frequency  Min 2X/week        Progress Toward Goals  OT Goals(current goals can now be found in the care plan section)  Progress towards OT goals: OT to reassess next treatment  Acute Rehab OT Goals Patient Stated Goal: going home at discharge  Plan Discharge plan remains appropriate    Co-evaluation    PT/OT/SLP Co-Evaluation/Treatment: Yes Reason for Co-Treatment: To address functional/ADL transfers;For patient/therapist safety PT goals addressed during session: Mobility/safety with mobility OT goals addressed during session: ADL's and self-care      AM-PAC OT "6 Clicks" Daily Activity     Outcome Measure   Help from another person eating meals?: A Little Help from another person taking care of personal grooming?: A Little Help from another person toileting, which includes  using toliet, bedpan, or urinal?: A Lot Help from another person bathing (including washing, rinsing, drying)?: A Lot Help from another person to put on and taking off regular upper body clothing?: A Little Help from another person to put on and taking off regular lower body clothing?: A Lot 6 Click Score: 15    End of Session    OT Visit Diagnosis: Other abnormalities of gait and mobility (R26.89)   Activity Tolerance Patient limited by fatigue   Patient Left in bed;with call bell/phone within reach;with bed alarm set   Nurse Communication Other (comment) (blood pressure and dizziness)        Time: 8937-3428 OT Time Calculation (min): 11 min  Charges: OT General Charges $OT Visit: 1 Visit  Sharyn Blitz OTR/L, MS Acute Rehabilitation Department Office# [phone] Pager# [phone]    Chalmers Guest Keani Gotcher 09/29/2020, 1:24 PM

## 2020-09-29 NOTE — Progress Notes (Addendum)
Progress Note  Patient Name: Dalton Juarez Date of Encounter: 09/29/2020  [facility] Cardiologist: Thurmon Fair, MD   Subjective   No SOB or CP.  HR controlled on the 80s to low 100s on tele in afib.  Had a drop in SBP to overnight but quickly rebounded to normal so ? Inaccurate reading  Inpatient Medications    Scheduled Meds:  acetylcysteine  4 mL Nebulization Q4H   [START ON 10/05/2020] amiodarone  200 mg Oral BID   [START ON 10/12/2020] amiodarone  200 mg Oral Daily   amiodarone  400 mg Oral BID   apixaban  5 mg Oral BID   atorvastatin  40 mg Oral Daily   bisoprolol  5 mg Oral BID   chlorhexidine  15 mL Mouth Rinse BID   Chlorhexidine Gluconate Cloth  6 each Topical Daily   cholecalciferol  2,000 Units Oral Daily   clopidogrel  75 mg Oral Daily   dorzolamide  1 drop Left Eye BID   gabapentin  300 mg Oral TID   hydrocortisone  25 mg Rectal BID   insulin aspart  0-15 Units Subcutaneous TID WC   insulin aspart  0-5 Units Subcutaneous QHS   mouth rinse  15 mL Mouth Rinse q12n4p   mometasone-formoterol  2 puff Inhalation BID   multivitamin with minerals  1 tablet Oral Daily   pantoprazole  40 mg Oral Daily   phenylephrine  1 suppository Rectal BID   polyethylene glycol  17 g Oral BID   revefenacin  175 mcg Nebulization Daily   senna-docusate  1 tablet Oral BID   tamsulosin  0.4 mg Oral Daily   Continuous Infusions:  sodium chloride Stopped (09/20/20 2035)   potassium chloride     PRN Meds: sodium chloride, acetaminophen **OR** acetaminophen, guaiFENesin-dextromethorphan, ipratropium, metoprolol tartrate, nitroGLYCERIN, witch hazel-glycerin   Vital Signs    Vitals:   09/29/20 0500 09/29/20 0600 09/29/20 0802 09/29/20 0804  BP: 134/80 134/64    Pulse: 84 89    Resp: 15 17    Temp:   98.1 F (36.7 C)   TempSrc:   Axillary   SpO2: 98% 97% 98% 99%  Weight: 99.7 kg     Height:        Intake/Output Summary (Last 24 hours) at 09/29/2020 1022 Last data  filed at 09/29/2020 2440 Gross per 24 hour  Intake 587.48 ml  Output 1950 ml  Net -1362.52 ml    Last 3 Weights 09/29/2020 09/28/2020 09/27/2020  Weight (lbs) 219 lb 12.8 oz 220 lb 3.8 oz 220 lb 7.4 oz  Weight (kg) 99.7 kg 99.9 kg 100 kg      Telemetry    Atrial fibrillation with HR 80-low 100s - Personally Reviewed  ECG    No new EKG to review - Personally Reviewed  Physical Exam   GEN: Well nourished, well developed in no acute distress HEENT: Normal NECK: No JVD; No carotid bruits LYMPHATICS: No lymphadenopathy CARDIAC:irregularly irregular, no murmurs, rubs, gallops RESPIRATORY:  Clear to auscultation without rales, wheezing or rhonchi  ABDOMEN: Soft, non-tender, non-distended MUSCULOSKELETAL:  trace dependent ankle edema; No deformity  SKIN: Warm and dry NEUROLOGIC:  Alert and oriented x 3 PSYCHIATRIC:  Normal affect   Labs    High Sensitivity Troponin:   Recent Labs  Lab 09/17/20 1314 09/17/20 1522  TROPONINIHS 35* 31*       Chemistry Recent Labs  Lab 09/27/20 0306 09/28/20 0305 09/29/20 0256  NA 139 141 138  K 4.7 3.7 3.1*  CL 88* 90* 89*  CO2 41* 39* 37*  GLUCOSE 129* 141* 114*  BUN 41* 39* 42*  CREATININE 1.13 1.12 1.18  CALCIUM 9.0 9.2 9.1  GFRNONAA >60 >60 >60  ANIONGAP 10 12 12       Hematology Recent Labs  Lab 09/26/20 0242 09/27/20 0306 09/29/20 0256  WBC 17.7* 16.1* 14.9*  RBC 4.34 4.48 4.41  HGB 11.8* 12.3* 12.1*  HCT 38.6* 40.2 39.0  MCV 88.9 89.7 88.4  MCH 27.2 27.5 27.4  MCHC 30.6 30.6 31.0  RDW 13.8 14.0 13.9  PLT 334 224 304     BNPNo results for input(s): BNP, PROBNP in the last 168 hours.   DDimer No results for input(s): DDIMER in the last 168 hours.   CHA2DS2-VASc Score = 6  This indicates a 9.7% annual risk of stroke. The patient's score is based upon: CHF History: Yes HTN History: Yes Diabetes History: Yes Stroke History: No Vascular Disease History: Yes Age Score: 2 Gender Score: 0   Radiology     No results found.  Cardiac Studies   2D echo 09/18/2020 IMPRESSIONS    1. Left ventricular ejection fraction by 3D volume is 49 %. The left  ventricle has mildly decreased function. The left ventricle has no  regional wall motion abnormalities. There is mild left ventricular  hypertrophy. Left ventricular diastolic parameters   are indeterminate.   2. Right ventricular systolic function is normal. The right ventricular  size is normal. There is mildly elevated pulmonary artery systolic  pressure. The estimated right ventricular systolic pressure is 40.5 mmHg.   3. Right atrial size was moderately dilated.   4. The mitral valve is normal in structure. Moderate mitral valve  regurgitation. No evidence of mitral stenosis.   5. The aortic valve is normal in structure. Aortic valve regurgitation is  not visualized. Mild to moderate aortic valve sclerosis/calcification is  present, without any evidence of aortic stenosis.   6. The inferior vena cava is normal in size with greater than 50%  respiratory variability, suggesting right atrial pressure of 3 mmHg.   Patient Profile     75 y.o. male with a history of CAD with prior stenting on DAPT ischemic cardiomyopathy/chronic systolic CHF, paroxysmal atrial fibrillation not on anticoagulation at home, s/p VATS decortication with mechanical pleurodesis at [facility], COPD on home O2, hypertension, hyperlipidemia, type 2 diabetes, GERD, and prior tobacco use (quit 4 years ago) admitted for shortness of breath progressive over months and was found to be in atrial fibrillation with a rapid rate.   Assessment & Plan    PAF/Afib with RVR -admitted with acute respiratory failure and found to be in atrial fibrillation  -CHADS2VASC score is 6 -started on Apixaban 5mg  BID -felt PAF was secondary to acute respiratory failure and there were concerns regarding anesthesia for TEE/DCCV due to his underlying pulmonary problems. -plan established over the weekend  was to try to hold off on TEE/DCCV and if HR controlled then plan DCCV in about 3 weeks from going on Eliquis -Heart rate is fairly well controlled today in the low 100's and should improve as respiratory status improves -continue Bisoprolol 5mg  BID and Eliquis 5mg  BID -stopped Cardizem yesterday due to soft BP  -continue Amio to 400mg  BID PO x 1 week then 200mg  BID for 1 week and then 200mg  daily  Acute Respiratory Failure/COPD exacerbation -patient has hx of O2 dependent COPD -dx with COPD exacerbation and now on antibx per [facility] -  Acute on chronic CHF likely contributing to respiratory failure as well  Acute on chronic combined systolic/diastolic CHF -BNP elevated at 416 -Echo with EF 49% -had been on IV Lasix 80mg  IV BID but stopped yesterday due to soft BP -He put out 1.9L  yesterday and is net net 17L -weight down 11lbs from admit -SCr stable at 1.18 -compression hose added for dependent ankle edema -Diamox given x 2 doses for elevated CO2 which is now down from 41 to 37 (suspect this is a chronic metabolic alkalosis compensated for his 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 -BP improved today but had 1 episode of hypotension last night so would hold off on  ACEI/ARB/ARNi or MRN for now and reassess outpt -consider addition of Farxiga 10mg  daily if no contraindications per [facility] -restart home dose of Lasix 20mg  daily PO  Elevated Troponin/ASCAD -minimally elevated at 35>32 -2D echo with very mild LV dysfunction and no focal wall motion abnormalities -s/p remote PCI with 2 stents in 2015 or 2016 in [place] 2016 -likely demand ischemia in the setting of afib with RVR and acute hypoxemic respiratory failure -no further ischemic workup at this time -He was on DAPT at home with ASA and Plavix -his ASA has been stopped due to starting Eliquis -continue Bisoprolol and  atorvastatin 40mg  daily  AKI -SCr bumped to 1.25 In setting of diuresis -improved today to 1.18  Hypokalemia -need to  keep K>4 -K+ 3.1 today -replete per [facility]  [facility] will sign off.   Medication Recommendations:  Amio to 400mg  BID PO x 1 week then 200mg  BID for 1 week and then 200mg  daily, Apixaban 5mg  BID, Atorvastatin 40mg  daily, Bisoprolol 5mg  BID, Plavix 75mg  daily, Lasix 20mg  daily, Kdur dosing per [facility] at discharge Other recommendations (labs, testing, etc):  BMET in 1 week Follow up as an outpatient:  Followup with Dr. 2017 in 1-2 weeks   I have spent a total of 35 minutes with patient reviewing hospital notes , telemetry, EKGs, labs and examining patient as well as establishing an assessment and plan that was discussed with the patient.  > 50% of time was spent in direct patient care.       For questions or updates, please contact [facility] Please consult [email or web address] for contact info under        Signed, [place], MD  09/29/2020, 10:22 AM

## 2020-09-29 NOTE — Progress Notes (Signed)
[facility]  PROGRESS NOTE  Dalton Juarez QZE:[id number] DOB: Feb 17, 1945 DOA: 09/17/2020 PCP: Smith Robert, MD   Brief HPI:   75 year old male with history of COPD on 2 L/min of oxygen via nasal cannula, diabetes mellitus type 2, CHF/ischemic cardiomyopathy, atrial fibrillation presented to ED with 2 to 3 weeks history of progressively worsening shortness of breath, 10 pound weight gain. In the ED he was found to be tachycardic, with A. fib with RVR.  Cardizem drip was started.  Chest x-ray showed bilateral patchy opacity was placed on Levaquin.  Cardiology was consulted.  Initially cardiology had planned for TEE/DCCV.  Pulmonology was consulted for clearance however PCCM recommended mechanical ventilation and intubation and TEE/DCCV under general anesthesia.  Patient has refused mechanical ventilation, he is DNR.  Family requested palliative care for discussion regarding hospice.  Palliative care has been consulted.  In the meantime atrial fibrillation is being managed by medications.    Subjective   Continues to have shortness of breath, also unable to cough up phlegm.   Assessment/Plan:    Acute on chronic combined systolic and diastolic CHF -Presented with BNP of 445; echo shows EF 49% -Started on Lasix 80 mg IV 3 times daily -Net -16 liters -Continue to monitor intake and output -CO2 elevated 41; was given  2 doses of Diamox 250 mg p.o. twice daily -Lasix is currently on hold per cardiology due to soft BP -Okay to start [place]   Atrial fibrillation with RVR -Heart rate is controlled -Started on Lopressor 50 mg 3 times daily, switch to bisoprolol 5 mg p.o. twice daily  -Cardizem was stopped by cardiology -Transitioned to p.o. amiodarone from  IV amiodarone -TSH is normal -Started on Eliquis for anticoagulation -Cardiology was  contemplating TEE guided cardioversion, pulmonology was consulted.  He does not feel that patient can undergo cardioversion under sedation and  have to be put under general anesthesia  with intubation  Positive troponin -Mild elevation of troponin.  35>> 32 -Likely demand ischemia -No ischemic work-up planned per cardiology -He was on DAPT with aspirin and Plavix at home -Aspirin stopped as patient is on Eliquis  Hypertension -Blood pressure is labile -Cardiology had stopped Cardizem due to soft BP  COPD exacerbation -Continue DuoNeb nebulizer, Solu-Medrol -Added flutter valve every 4 hours -Continue Mucinex, prednisone 40 mg daily -We will start Mucomyst nebulizer every 4 hours for 4 treatments -Start chest PT every 4 hours  Acute kidney injury -In setting of diuresis; creatinine is 1.18 -We will closely monitor patient's creatinine  Diabetes mellitus type 2 -Continue sliding scale insulin with NovoLog -CBG well controlled   Hemorrhoids -Anusol HC suppository twice a day  Constipation -No improvement with Dulcolax suppository -We will give milk and molasses enema  Urinary retention -Started on Flomax 0.4 mg daily -Had multiple episodes requiring in and out cath -Foley catheter inserted on 09/27/2020  Leukocytosis -WBC has been elevated;afebrile, no signs of infection -Likely from steroids  Hypokalemia -Potassium is 3.1 -We will give IV KCl 10 mg x 3 -Follow BMP in am  ?  Right-sided facial droop -CT head was negative for stroke -No focal neurological deficit -Appearance of right-sided facial droop could have been due to mask of BiPAP -Resolved  Goals of care -Discussed with patient's wife and patient -They want to discuss with palliative care regarding goals of care and possible hospice option -palliative care consulted for goals of care discussion  Scheduled medications:    acetylcysteine  4 mL Nebulization Q4H   [START  ON 10/05/2020] amiodarone  200 mg Oral BID   [START ON 10/12/2020] amiodarone  200 mg Oral Daily   amiodarone  400 mg Oral BID   apixaban  5 mg Oral BID   atorvastatin  40 mg  Oral Daily   bisoprolol  5 mg Oral BID   chlorhexidine  15 mL Mouth Rinse BID   Chlorhexidine Gluconate Cloth  6 each Topical Daily   cholecalciferol  2,000 Units Oral Daily   clopidogrel  75 mg Oral Daily   dorzolamide  1 drop Left Eye BID   gabapentin  300 mg Oral TID   hydrocortisone  25 mg Rectal BID   insulin aspart  0-15 Units Subcutaneous TID WC   insulin aspart  0-5 Units Subcutaneous QHS   mouth rinse  15 mL Mouth Rinse q12n4p   mometasone-formoterol  2 puff Inhalation BID   multivitamin with minerals  1 tablet Oral Daily   pantoprazole  40 mg Oral Daily   phenylephrine  1 suppository Rectal BID   polyethylene glycol  17 g Oral BID   revefenacin  175 mcg Nebulization Daily   senna-docusate  1 tablet Oral BID   tamsulosin  0.4 mg Oral Daily         Data Reviewed:   CBG:  Recent Labs  Lab 09/28/20 0736 09/28/20 1219 09/28/20 1642 09/28/20 2208 09/29/20 0802  GLUCAP 113* 150* 144* 165* 102*    SpO2: 99 % O2 Flow Rate (L/min): 4 L/min FiO2 (%): 30 %    Vitals:   09/29/20 0500 09/29/20 0600 09/29/20 0802 09/29/20 0804  BP: 134/80 134/64    Pulse: 84 89    Resp: 15 17    Temp:   98.1 F (36.7 C)   TempSrc:   Axillary   SpO2: 98% 97% 98% 99%  Weight: 99.7 kg     Height:         Intake/Output Summary (Last 24 hours) at 09/29/2020 0935 Last data filed at 09/29/2020 0656 Gross per 24 hour  Intake 587.48 ml  Output 1950 ml  Net -1362.52 ml    08/21 1901 - 08/23 0700 In: 932.1 [P.O.:480; I.V.:452.1] Out: 3700 [Urine:3700]  Filed Weights   09/27/20 0400 09/28/20 0500 09/29/20 0500  Weight: 100 kg 99.9 kg 99.7 kg    CBC:  Recent Labs  Lab 09/23/20 0300 09/24/20 0244 09/26/20 0242 09/27/20 0306 09/29/20 0256  WBC 14.5* 17.6* 17.7* 16.1* 14.9*  HGB 11.6* 12.2* 11.8* 12.3* 12.1*  HCT 37.8* 39.6 38.6* 40.2 39.0  PLT 285 305 334 224 304  MCV 89.4 89.2 88.9 89.7 88.4  MCH 27.4 27.5 27.2 27.5 27.4  MCHC 30.7 30.8 30.6 30.6 31.0  RDW 13.9  14.0 13.8 14.0 13.9    Complete metabolic panel:  Recent Labs  Lab 09/23/20 0300 09/24/20 0244 09/26/20 0242 09/27/20 0306 09/28/20 0305 09/29/20 0256  NA 141 141 142 139 141 138  K 4.1 3.9 3.5 4.7 3.7 3.1*  CL 96* 92* 89* 88* 90* 89*  CO2 34* 38* 40* 41* 39* 37*  GLUCOSE 141* 135* 141* 129* 141* 114*  BUN 54* 48* 44* 41* 39* 42*  CREATININE 1.37* 1.25* 1.17 1.13 1.12 1.18  CALCIUM 8.6* 8.7* 8.8* 9.0 9.2 9.1  MG 2.1  --   --   --   --   --   PROCALCITON <0.10  --   --   --   --   --     No results for input(s): LIPASE,  AMYLASE in the last 168 hours.  Recent Labs  Lab 09/23/20 0300  PROCALCITON <0.10    ------------------------------------------------------------------------------------------------------------------ No results for input(s): CHOL, HDL, LDLCALC, TRIG, CHOLHDL, LDLDIRECT in the last 72 hours.  Lab Results  Component Value Date   HGBA1C 6.3 (H) 09/17/2020   ------------------------------------------------------------------------------------------------------------------ No results for input(s): TSH, T4TOTAL, T3FREE, THYROIDAB in the last 72 hours.  Invalid input(s): FREET3 ------------------------------------------------------------------------------------------------------------------ No results for input(s): VITAMINB12, FOLATE, FERRITIN, TIBC, IRON, RETICCTPCT in the last 72 hours.  Coagulation profile No results for input(s): INR, PROTIME in the last 168 hours.  No results for input(s): DDIMER in the last 72 hours.  Cardiac Enzymes No results for input(s): CKTOTAL, CKMB, CKMBINDEX, TROPONINI in the last 168 hours.  ------------------------------------------------------------------------------------------------------------------    Component Value Date/Time   BNP 445.0 (H) 09/17/2020 1314     Antibiotics: Anti-infectives (From admission, onward)    Start     Dose/Rate Route Frequency Ordered Stop   09/17/20 1415  levofloxacin (LEVAQUIN)  IVPB 500 mg        500 mg 100 mL/hr over 60 Minutes Intravenous  Once 09/17/20 1400 09/17/20 1818        Radiology Reports  No results found.    DVT prophylaxis: Apixaban  Code Status: Full code  Family Communication: discussed with wife at bedside   Consultants: Cardiology PCCM  Procedures:     Objective    Physical Examination:  General-appears in no acute distress Heart-S1-S2, regular, no murmur auscultated Lungs-bilateral rhonchi auscultated Abdomen-soft, nontender, no organomegaly Extremities-no edema in the lower extremities Neuro-alert, oriented x3, no focal deficit noted  Status is: Inpatient  Dispo: The patient is from: Home              Anticipated d/c is to: Home              Anticipated d/c date is: 10/01/2020              Patient currently currently not stable for discharge  Barrier to discharge-ongoing treatment for A. fib with RVR  COVID-19 Labs  No results for input(s): DDIMER, FERRITIN, LDH, CRP in the last 72 hours.  Lab Results  Component Value Date   SARSCOV2NAA NEGATIVE 09/17/2020      Meredeth Ide   [facility] If 7PM-7AM, please contact night-coverage at [email or web address], Office  [phone]   09/29/2020, 9:35 AM  LOS: 12 days

## 2020-09-29 NOTE — Progress Notes (Signed)
Physical Therapy Treatment Patient Details Name: Dalton Juarez MRN: [id number] DOB: Jul 24, 1945 Today's Date: 09/29/2020    History of Present Illness 75 year old male with history of COPD on 2 L/min of oxygen, diabetes mellitus type 2, CHF/ischemic cardiomyopathy, atrial fibrillation presented to ED 09/17/20 with worsening shortness of breath, 10 pound weight gain.  In the ED he was found to be tachycardic, with A. fib with RVR.   Chest x-ray showed bilateral patchy opacity    PT Comments    Patient with frequent productive coughing., reports feeling dizzy when sitting upright. Only tolerated sitting on bed edge for ~ 2 minutes.  BP supine 82/63, seated 91/57. SPo2 96% on 4 L.  Patient has declined in physical abilities in past few days. Stes he just wants to sleep.   Follow Up Recommendations  SNF     Equipment Recommendations  None recommended by PT    Recommendations for Other Services       Precautions / Restrictions Precautions Precautions: Fall Precaution Comments: monitor HR and SATS Restrictions Weight Bearing Restrictions: No    Mobility  Bed Mobility Overal bed mobility: Needs Assistance Bed Mobility: Supine to Sit     Supine to sit: HOB elevated;Min guard     General bed mobility comments: patient required min guard for supine to sit on edge of bed with HOB elevated with education for sequencing provided.    Transfers                 General transfer comment: NT, dizziness and BP soft.  Ambulation/Gait                 Stairs             Wheelchair Mobility    Modified Rankin (Stroke Patients Only)       Balance Overall balance assessment: Needs assistance Sitting-balance support: Feet supported;Bilateral upper extremity supported Sitting balance-Leahy Scale: Fair Sitting balance - Comments: patientr sits at midline.       Standing balance comment: NT                            Cognition  Arousal/Alertness: Awake/alert Behavior During Therapy: Restless Overall Cognitive Status: Within Functional Limits for tasks assessed                                 General Comments: states he wants to sleep      Exercises      General Comments General comments (skin integrity, edema, etc.): patients blood pressure at start of session was 82/63 mmhg supine in bed. sitting on edge of bed patients blood pressure was 91/57 mmhg. patient reported increasing dizziness sitting edge of bed that was not subsiding with increased time sitting. patient was returned to supine in bed with all needs within reach.      Pertinent Vitals/Pain Pain Assessment: No/denies pain    Home Living                      Prior Function            PT Goals (current goals can now be found in the care plan section) Acute Rehab PT Goals Patient Stated Goal: going home at discharge Progress towards PT goals: Not progressing toward goals - comment (much weaker, BP low)    Frequency    Min 2X/week  PT Plan      Co-evaluation PT/OT/SLP Co-Evaluation/Treatment: Yes Reason for Co-Treatment: For patient/therapist safety PT goals addressed during session: Mobility/safety with mobility OT goals addressed during session: ADL's and self-care      AM-PAC PT "6 Clicks" Mobility   Outcome Measure  Help needed turning from your back to your side while in a flat bed without using bedrails?: A Little Help needed moving from lying on your back to sitting on the side of a flat bed without using bedrails?: A Little Help needed moving to and from a bed to a chair (including a wheelchair)?: A Lot Help needed standing up from a chair using your arms (e.g., wheelchair or bedside chair)?: A Lot Help needed to walk in hospital room?: A Lot Help needed climbing 3-5 steps with a railing? : Total 6 Click Score: 13    End of Session   Activity Tolerance: Patient limited by fatigue;Treatment  limited secondary to medical complications (Comment) Patient left: in bed;with call bell/phone within reach;with bed alarm set Nurse Communication: Mobility status PT Visit Diagnosis: Difficulty in walking, not elsewhere classified (R26.2);Muscle weakness (generalized) (M62.81)     Time: 5102-5852 PT Time Calculation (min) (ACUTE ONLY): 16 min  Charges:  $Therapeutic Activity: 8-22 mins                     Blanchard Kelch PT Acute Rehabilitation Services Pager [phone] Office [phone]    Rada Hay 09/29/2020, 2:25 PM

## 2020-09-30 LAB — COMPREHENSIVE METABOLIC PANEL
ALT: 35 U/L (ref 0–44)
AST: 23 U/L (ref 15–41)
Albumin: 3.1 g/dL — ABNORMAL LOW (ref 3.5–5.0)
Alkaline Phosphatase: 66 U/L (ref 38–126)
Anion gap: 9 (ref 5–15)
BUN: 37 mg/dL — ABNORMAL HIGH (ref 8–23)
CO2: 36 mmol/L — ABNORMAL HIGH (ref 22–32)
Calcium: 8.9 mg/dL (ref 8.9–10.3)
Chloride: 92 mmol/L — ABNORMAL LOW (ref 98–111)
Creatinine, Ser: 1.12 mg/dL (ref 0.61–1.24)
GFR, Estimated: >60 mL/min (ref >60)
Glucose, Bld: 107 mg/dL — ABNORMAL HIGH (ref 70–99)
Potassium: 3.4 mmol/L — ABNORMAL LOW (ref 3.5–5.1)
Sodium: 137 mmol/L (ref 135–145)
Total Bilirubin: 1.3 mg/dL — ABNORMAL HIGH (ref 0.3–1.2)
Total Protein: 6.1 g/dL — ABNORMAL LOW (ref 6.5–8.1)

## 2020-09-30 LAB — GLUCOSE, CAPILLARY
Glucose-Capillary: 107 mg/dL — ABNORMAL HIGH (ref 70–99)
Glucose-Capillary: 135 mg/dL — ABNORMAL HIGH (ref 70–99)
Glucose-Capillary: 196 mg/dL — ABNORMAL HIGH (ref 70–99)
Glucose-Capillary: 175 mg/dL — ABNORMAL HIGH (ref 70–99)

## 2020-09-30 LAB — CBC
HCT: 37.7 % — ABNORMAL LOW (ref 39.0–52.0)
Hemoglobin: 11.6 g/dL — ABNORMAL LOW (ref 13.0–17.0)
MCH: 27.2 pg (ref 26.0–34.0)
MCHC: 30.8 g/dL (ref 30.0–36.0)
MCV: 88.3 fL (ref 80.0–100.0)
Platelets: 267 10*3/uL (ref 150–400)
RBC: 4.27 MIL/uL (ref 4.22–5.81)
RDW: 14.1 % (ref 11.5–15.5)
WBC: 17.4 10*3/uL — ABNORMAL HIGH (ref 4.0–10.5)
nRBC: 0 % (ref 0.0–0.2)

## 2020-09-30 MED ORDER — POTASSIUM CHLORIDE CRYS ER 20 MEQ PO TBCR
60.0000 meq | EXTENDED_RELEASE_TABLET | Freq: Once | ORAL | Status: AC
  Administered 2020-09-30: 60 meq via ORAL
  Filled 2020-09-30: qty 3

## 2020-09-30 NOTE — Progress Notes (Signed)
Family requesting RN in room urgently. Patient noted to be red in face and coughing hard. Family states that he was eating and became choked on lunch. Patient coughed food up and RN administered inhaler per patient request after cough calmed down. Patient states his breathing feels clear and no longer feels choked.   RN educated family and patient on the importance of sitting up 90 degrees prior to meals and for time after. Also educated on the risk of aspiration pneumonia-patient is to be discharged with home hospice in upcoming days.

## 2020-09-30 NOTE — Progress Notes (Signed)
Patient declines CPT during sleeping hours. Asleep at this time. RN aware of patient wishes.

## 2020-09-30 NOTE — Progress Notes (Signed)
Daily Progress Note   Patient Name: Dalton Juarez       Date: 09/30/2020 DOB: 1946/01/09  Age: 75 y.o. MRN#: [id number] Attending Physician: Donne Hazel, MD Primary Care Physician: Vidal Schwalbe, MD Admit Date: 09/17/2020  Reason for Consultation/Follow-up: Establishing goals of care  Subjective: I met today with Mr. Dalton Juarez, his wife, and family friend, Dalton Juarez.  His son, Dalton Juarez, joined via phone as well.  We discussed clinical course as well as wishes moving forward in regard to advanced directives and care plan this hospitalization. We discussed multiple comorbidiities and his feelings of being "tired" and wanting to go home.  Concepts specific to code status and rehospitalization discussed.  We discussed difference between a aggressive medical intervention path and a palliative, comfort focused care path.    Concept of Hospice and Palliative Care were discussed.  He and wife expressed familiarity with hospice from other family members. They both clearly state their goal is to have him transition home with hospice services.   Questions and concerns addressed.   PMT will continue to support holistically.   Length of Stay: 13  Current Medications: Scheduled Meds:   [START ON 10/05/2020] amiodarone  200 mg Oral BID   [START ON 10/12/2020] amiodarone  200 mg Oral Daily   amiodarone  400 mg Oral BID   apixaban  5 mg Oral BID   atorvastatin  40 mg Oral Daily   bisoprolol  5 mg Oral BID   chlorhexidine  15 mL Mouth Rinse BID   Chlorhexidine Gluconate Cloth  6 each Topical Daily   cholecalciferol  2,000 Units Oral Daily   clopidogrel  75 mg Oral Daily   dorzolamide  1 drop Left Eye BID   furosemide  20 mg Oral Daily   gabapentin  300 mg Oral TID   hydrocortisone  25 mg Rectal BID   insulin  aspart  0-15 Units Subcutaneous TID WC   insulin aspart  0-5 Units Subcutaneous QHS   mouth rinse  15 mL Mouth Rinse q12n4p   mometasone-formoterol  2 puff Inhalation BID   multivitamin with minerals  1 tablet Oral Daily   pantoprazole  40 mg Oral Daily   phenylephrine  1 suppository Rectal BID   polyethylene glycol  17 g Oral BID   revefenacin  175 mcg Nebulization Daily  senna-docusate  1 tablet Oral BID   tamsulosin  0.4 mg Oral Daily    Continuous Infusions:  sodium chloride Stopped (09/20/20 2035)    PRN Meds: sodium chloride, acetaminophen **OR** acetaminophen, albuterol, guaiFENesin-dextromethorphan, ipratropium, metoprolol tartrate, nitroGLYCERIN, witch hazel-glycerin  Physical Exam   General: Alert, awake, in no acute distress.   HEENT: No bruits, no goiter, no JVD Heart: Rate controlled. No murmur appreciated. Lungs: Fair air movement, scattered ronchi Abdomen: Soft, nontender, nondistended, positive bowel sounds.   Ext: +edema Skin: Warm and dry Neuro: Grossly intact, nonfocal.   Vital Signs: BP (!) 116/54 (BP Location: Right Arm)   Pulse 94   Temp 97.7 F (36.5 C) (Oral)   Resp (!) 22   Ht _0  (1.702 m)   Wt 99.7 kg   SpO2 99%   BMI 34.43 kg/m  SpO2: SpO2: 99 % O2 Device: O2 Device: Nasal Cannula O2 Flow Rate: O2 Flow Rate (L/min): 4 L/min  Intake/output summary:  Intake/Output Summary (Last 24 hours) at 09/30/2020 2135 Last data filed at 09/30/2020 2000 Gross per 24 hour  Intake 240 ml  Output 400 ml  Net -160 ml   LBM: Last BM Date: 09/29/20 Baseline Weight: Weight: 99.8 kg Most recent weight: Weight: 99.7 kg       Palliative Assessment/Data:    Flowsheet Rows    Flowsheet Row Most Recent Value  Intake Tab   Referral Department Hospitalist  Unit at Time of Referral ICU  Palliative Care Primary Diagnosis Cardiac  Date Notified 09/28/20  Palliative Care Type New Palliative care  Reason for referral Clarify Goals of Care  Date of  Admission 09/17/20  Date first seen by Palliative Care 09/29/20  # of days Palliative referral response time 1 Day(s)  # of days IP prior to Palliative referral 11  Clinical Assessment   Palliative Performance Scale Score 30%  Psychosocial & Spiritual Assessment   Palliative Care Outcomes   Patient/Family meeting held? Yes  Who was at the meeting? patient       Patient Active Problem List   Diagnosis Date Noted   CHF exacerbation ([place]) 09/17/2020   CAD (coronary artery disease) 09/17/2020   Atrial fibrillation with RVR ([place]) 09/17/2020   COPD with acute exacerbation ([place]) 09/17/2020    Palliative Care Assessment & Plan   Patient Profile: 75 y.o. male  with past medical history of COPD, diabetes, CHF, ischemic cardiomyopathy, A. fib admitted on 09/17/2020 with weight gain and worsening shortness of breath.  He was found to be in A. fib with RVR and had pneumonia and was subsequently started on Levaquin.  Initially, plan was for TEE/DCCV, however, it was recommended that this be done under mechanical ventilation and general anesthesia.  Patient clear he is a DNR and family requested palliative care discussion for consideration for hospice.  Recommendations/Plan: DNR/DNI Goal is to work to transition home with hospice support.  Discussed plan to continue current therapies while we are arranging home hospice. Discussed with TOC and appreciate assistance locating hospice provider or his home area.  He will need transport home by ambulance.  Goals of Care and Additional Recommendations: Limitations on Scope of Treatment: Avoid Hospitalization  Code Status:    Code Status Orders  (From admission, onward)           Start     Ordered   09/27/20 1355  Do not attempt resuscitation (DNR)  Continuous       Question Answer Comment  In the event of  cardiac or respiratory ARREST Do not call a "code blue"   In the event of cardiac or respiratory ARREST Do not perform Intubation, CPR,  defibrillation or ACLS   In the event of cardiac or respiratory ARREST Use medication by any route, position, wound care, and other measures to relive pain and suffering. May use oxygen, suction and manual treatment of airway obstruction as needed for comfort.      09/27/20 1354           Code Status History     Date Active Date Inactive Code Status Order ID Comments User Context   09/17/2020 1548 09/27/2020 1354 Full Code [id number]  Donne Hazel, MD ED       Prognosis:  < 6 months  Discharge Planning: Home with Hospice  Care plan was discussed with patient, wife, friend, son, Dr. Wyline Copas, [facility]  Thank you for allowing the Palliative Medicine Team to assist in the care of this patient.   Time In: 1120 Time Out: 1200 Total Time 40 Prolonged Time Billed No      Greater than 50%  of this time was spent counseling and coordinating care related to the above assessment and plan.  Micheline Rough, MD  Please contact Palliative Medicine Team phone at [phone] for questions and concerns.

## 2020-09-30 NOTE — Progress Notes (Signed)
RT NOTE:  Pt wishes to hold off on his CPT this round due to a recent "choking spell." Vitals are stable at this time, RT will continue to monitor.

## 2020-09-30 NOTE — Progress Notes (Signed)
Palliative care brief note  I met today with Dalton Juarez.  We discussed clinical course and wishes for care moving forward.  We have set up a meeting with him and his wife for tomorrow between 73 and 1300.  Full consult note to follow.  Micheline Rough, MD [place] Team [phone]

## 2020-09-30 NOTE — TOC Initial Note (Signed)
Transition of Care [facility]) - Initial/Assessment Note    Patient Details  Name: Dalton Juarez MRN: [id number] Date of Birth: 1945/10/16  Transition of Care [facility]) CM/SW Contact:    Armanda Heritage, RN Phone Number: 09/30/2020, 4:19 PM  Clinical Narrative:                 CM spoke with patient at bedside regarding discharge planning.  CM also left a HIPPA compliant VM for spouse.  Patient reports he would like to return home with hospice at discharge, CM discussed hospice providers available in his area and patient selects [facility].  Patient also requests DME hospital bed and 3in1.  CM spoke with [facility] rep and provided referral, rep to verify that services are available in [place].    At discharge patient will need ambulance transport to home, CM verified address in chart and noted location is 47 miles from hospital, which falls within the 50 mile radius required for transport.   TOC to follow, acceptance by [facility] is pending verification of service availability in patient's area.   Expected Discharge Plan: Home w Hospice Care Barriers to Discharge: No Barriers Identified   Patient Goals and CMS Choice Patient states their goals for this hospitalization and ongoing recovery are:: wants to return home with hospice CMS Medicare.gov Compare Post Acute Care list provided to:: Patient Choice offered to / list presented to : Patient  Expected Discharge Plan and Services Expected Discharge Plan: Home w Hospice Care   Discharge Planning Services: CM Consult Post Acute Care Choice: Hospice Living arrangements for the past 2 months: Single Family Home                                      Prior Living Arrangements/Services Living arrangements for the past 2 months: Single Family Home Lives with:: Spouse Patient language and need for interpreter reviewed:: Yes        Need for Family Participation in Patient Care: Yes (Comment) Care giver support system  in place?: Yes (comment)   Criminal Activity/Legal Involvement Pertinent to Current Situation/Hospitalization: No - Comment as needed  Activities of Daily Living Home Assistive Devices/Equipment: None ADL Screening (condition at time of admission) Patient's cognitive ability adequate to safely complete daily activities?: Yes Is the patient deaf or have difficulty hearing?: No Does the patient have difficulty seeing, even when wearing glasses/contacts?: No Does the patient have difficulty concentrating, remembering, or making decisions?: No Patient able to express need for assistance with ADLs?: Yes Does the patient have difficulty dressing or bathing?: No Independently performs ADLs?: Yes (appropriate for developmental age) Does the patient have difficulty walking or climbing stairs?: Yes Weakness of Legs: None Weakness of Arms/Hands: None  Permission Sought/Granted                  Emotional Assessment Appearance:: Appears stated age Attitude/Demeanor/Rapport: Engaged Affect (typically observed): Accepting Orientation: : Oriented to Self, Oriented to Place, Oriented to  Time, Oriented to Situation   Psych Involvement: No (comment)  Admission diagnosis:  Cough [R05.9] CHF exacerbation (HCC) [I50.9] Atrial fibrillation, unspecified type [facility]) [I48.91] Patient Active Problem List   Diagnosis Date Noted   CHF exacerbation (HCC) 09/17/2020   CAD (coronary artery disease) 09/17/2020   Atrial fibrillation with RVR (HCC) 09/17/2020   COPD with acute exacerbation (HCC) 09/17/2020   PCP:  Smith Robert, MD Pharmacy:  No Pharmacies Listed  Social Determinants of Health (SDOH) Interventions    Readmission Risk Interventions Readmission Risk Prevention Plan 09/30/2020  Transportation Screening Complete  Home Care Screening Complete  Medication Review (RN CM) Complete

## 2020-09-30 NOTE — Progress Notes (Signed)
PROGRESS NOTE    Dalton Juarez  UTM:[id number] DOB: 11/27/1945 DOA: 09/17/2020 PCP: Smith Robert, MD    Brief Narrative:  75 year old male with history of COPD on 2 L/min of oxygen via nasal cannula, diabetes mellitus type 2, CHF/ischemic cardiomyopathy, atrial fibrillation presented to ED with 2 to 3 weeks history of progressively worsening shortness of breath, 10 pound weight gain. In the ED he was found to be tachycardic, with A. fib with RVR.  Cardizem drip was started.  Chest x-ray showed bilateral patchy opacity was placed on Levaquin.  Cardiology was consulted.  Initially cardiology had planned for TEE/DCCV.  Pulmonology was consulted for clearance however PCCM recommended mechanical ventilation and intubation and TEE/DCCV under general anesthesia.  Patient has refused mechanical ventilation, he is DNR.  Family requested palliative care for discussion regarding hospice.  Palliative care has been consulted.  In the meantime atrial fibrillation is being managed by medications.   Assessment & Plan:   Principal Problem:   CHF exacerbation (HCC) Active Problems:   CAD (coronary artery disease)   Atrial fibrillation with RVR (HCC)   COPD with acute exacerbation (HCC)    Acute on chronic combined systolic and diastolic CHF -Presented with BNP of 445; echo shows EF 49% -Started on Lasix 80 mg IV 3 times daily -Net -17.7 liters -Continue to monitor intake and output -Cardiology recommendations [facility]:LEXN to 400mg  BID PO x 1 week then 200mg  BID for 1 week and then 200mg  daily, Apixaban 5mg  BID, Atorvastatin 40mg  daily, Bisoprolol 5mg  BID, Plavix 75mg  daily, Lasix 20mg  daily -Cardiology has since signed off   Atrial fibrillation with RVR -Heart rate is controlled -Started on Lopressor 50 mg 3 times daily, switch to bisoprolol 5 mg p.o. twice daily  -Cardizem was stopped by cardiology -Transitioned to p.o. amiodarone from  IV amiodarone -TSH is normal -Started on Eliquis for  anticoagulation -Cardiology was  contemplating TEE guided cardioversion, pulmonology was consulted.  He does not feel that patient can undergo cardioversion under sedation and have to be put under general anesthesia  with intubation -Palliative care now following   Positive troponin -Mild elevation of troponin.  35>> 32 -Likely demand ischemia -No ischemic work-up planned per cardiology -He was on DAPT with aspirin and Plavix at home -Aspirin stopped as patient is on Eliquis able   Hypertension -Blood pressure is labile -Cardiology had stopped Cardizem due to soft BP -Blood pressure stable at present   COPD exacerbation -Continue DuoNeb nebulizer, Solu-Medrol -Added flutter valve every 4 hours -Continue Mucinex, prednisone 40 mg daily -Was recently started on Mucomyst nebulizer every 4 hours for 4 treatments -Chest PT was ordered every 4 hours   Acute kidney injury -In setting of diuresis; creatinine is 1.18 -We will closely monitor patient's creatinine   Diabetes mellitus type 2 -Continue sliding scale insulin with NovoLog -CBG well controlled    Hemorrhoids -Anusol HC suppository twice a day   Constipation -No improvement with Dulcolax suppository -Patient reportedly given a trial of milk and molasses enema   Urinary retention -Started on Flomax 0.4 mg daily -Had multiple episodes requiring in and out cath -Foley catheter inserted on 09/27/2020 -Patient reports urinary tract discomfort -We will attempt DC Foley cath today, voiding trial   Leukocytosis -WBC has been elevated;afebrile, no signs of infection -Likely from steroids   Hypokalemia -Potassium is 3.1 -We will give IV KCl 10 mg x 3 -Follow BMP in am   ?  Right-sided facial droop -CT head was negative for stroke -No  focal neurological deficit -Appearance of right-sided facial droop could have been due to mask of BiPAP -Noted to be resolved   Goals of care -Appreciate assistance by palliative  care -Family meeting planned for tomorrow     DVT prophylaxis: Eliquis Code Status: DNR Family Communication: Pt in room, family not at bedside  Status is: Inpatient  Remains inpatient appropriate because:Inpatient level of care appropriate due to severity of illness  Dispo: The patient is from: Home              Anticipated d/c is to: SNF              Patient currently is not medically stable to d/c.   Difficult to place patient No       Consultants:  Cardiology PCCM  Procedures:    Antimicrobials: Anti-infectives (From admission, onward)    Start     Dose/Rate Route Frequency Ordered Stop   09/17/20 1415  levofloxacin (LEVAQUIN) IVPB 500 mg        500 mg 100 mL/hr over 60 Minutes Intravenous  Once 09/17/20 1400 09/17/20 1818       Subjective: Feeling weak this AM  Objective: Vitals:   09/30/20 0809 09/30/20 1000 09/30/20 1100 09/30/20 1200  BP:  (!) 143/68 (!) 102/55   Pulse:  94 91   Resp:  20 (!) 21   Temp:    97.7 F (36.5 C)  TempSrc:    Oral  SpO2: 100% 96% 97%   Weight:      Height:        Intake/Output Summary (Last 24 hours) at 09/30/2020 1526 Last data filed at 09/30/2020 0500 Gross per 24 hour  Intake 99.81 ml  Output 900 ml  Net -800.19 ml   Filed Weights   09/27/20 0400 09/28/20 0500 09/29/20 0500  Weight: 100 kg 99.9 kg 99.7 kg    Examination: General exam: Awake, laying in bed, in nad Respiratory system: Normal respiratory effort, no wheezing Cardiovascular system: regular rate, s1, s2 Gastrointestinal system: Soft, nondistended, positive BS Central nervous system: CN2-12 grossly intact, strength intact Extremities: Perfused, no clubbing Skin: Normal skin turgor, no notable skin lesions seen Psychiatry: Mood normal // no visual hallucinations   Data Reviewed: I have personally reviewed following labs and imaging studies  CBC: Recent Labs  Lab 09/24/20 0244 09/26/20 0242 09/27/20 0306 09/29/20 0256 09/30/20 0316   WBC 17.6* 17.7* 16.1* 14.9* 17.4*  HGB 12.2* 11.8* 12.3* 12.1* 11.6*  HCT 39.6 38.6* 40.2 39.0 37.7*  MCV 89.2 88.9 89.7 88.4 88.3  PLT 305 334 224 304 267   Basic Metabolic Panel: Recent Labs  Lab 09/26/20 0242 09/27/20 0306 09/28/20 0305 09/29/20 0256 09/30/20 0316  NA 142 139 141 138 137  K 3.5 4.7 3.7 3.1* 3.4*  CL 89* 88* 90* 89* 92*  CO2 40* 41* 39* 37* 36*  GLUCOSE 141* 129* 141* 114* 107*  BUN 44* 41* 39* 42* 37*  CREATININE 1.17 1.13 1.12 1.18 1.12  CALCIUM 8.8* 9.0 9.2 9.1 8.9   GFR: Estimated Creatinine Clearance: 62.1 mL/min (by C-G formula based on SCr of 1.12 mg/dL). Liver Function Tests: Recent Labs  Lab 09/30/20 0316  AST 23  ALT 35  ALKPHOS 66  BILITOT 1.3*  PROT 6.1*  ALBUMIN 3.1*   No results for input(s): LIPASE, AMYLASE in the last 168 hours. No results for input(s): AMMONIA in the last 168 hours. Coagulation Profile: No results for input(s): INR, PROTIME in the last 168 hours.  Cardiac Enzymes: No results for input(s): CKTOTAL, CKMB, CKMBINDEX, TROPONINI in the last 168 hours. BNP (last 3 results) No results for input(s): PROBNP in the last 8760 hours. HbA1C: No results for input(s): HGBA1C in the last 72 hours. CBG: Recent Labs  Lab 09/29/20 1127 09/29/20 1631 09/29/20 2017 09/30/20 0821 09/30/20 1250  GLUCAP 124* 129* 182* 107* 135*   Lipid Profile: No results for input(s): CHOL, HDL, LDLCALC, TRIG, CHOLHDL, LDLDIRECT in the last 72 hours. Thyroid Function Tests: No results for input(s): TSH, T4TOTAL, FREET4, T3FREE, THYROIDAB in the last 72 hours. Anemia Panel: No results for input(s): VITAMINB12, FOLATE, FERRITIN, TIBC, IRON, RETICCTPCT in the last 72 hours. Sepsis Labs: No results for input(s): PROCALCITON, LATICACIDVEN in the last 168 hours.  No results found for this or any previous visit (from the past 240 hour(s)).   Radiology Studies: No results found.  Scheduled Meds:  [START ON 10/05/2020] amiodarone  200 mg Oral  BID   [START ON 10/12/2020] amiodarone  200 mg Oral Daily   amiodarone  400 mg Oral BID   apixaban  5 mg Oral BID   atorvastatin  40 mg Oral Daily   bisoprolol  5 mg Oral BID   chlorhexidine  15 mL Mouth Rinse BID   Chlorhexidine Gluconate Cloth  6 each Topical Daily   cholecalciferol  2,000 Units Oral Daily   clopidogrel  75 mg Oral Daily   dorzolamide  1 drop Left Eye BID   furosemide  20 mg Oral Daily   gabapentin  300 mg Oral TID   hydrocortisone  25 mg Rectal BID   insulin aspart  0-15 Units Subcutaneous TID WC   insulin aspart  0-5 Units Subcutaneous QHS   mouth rinse  15 mL Mouth Rinse q12n4p   mometasone-formoterol  2 puff Inhalation BID   multivitamin with minerals  1 tablet Oral Daily   pantoprazole  40 mg Oral Daily   phenylephrine  1 suppository Rectal BID   polyethylene glycol  17 g Oral BID   revefenacin  175 mcg Nebulization Daily   senna-docusate  1 tablet Oral BID   tamsulosin  0.4 mg Oral Daily   Continuous Infusions:  sodium chloride Stopped (09/20/20 2035)     LOS: 13 days   Rickey Barbara, MD [facility] Pager On Amion  If 7PM-7AM, please contact night-coverage 09/30/2020, 3:26 PM

## 2020-09-30 NOTE — Progress Notes (Signed)
CPT held at this time. Patient is eating.

## 2020-09-30 NOTE — Consult Note (Signed)
Consultation Note Date: 09/30/2020   Patient Name: Dalton Juarez  DOB: 09-Nov-1945  MRN: [id number]  Age / Sex: 75 y.o., male  PCP: Vidal Schwalbe, MD Referring Physician: Donne Hazel, MD  Reason for Consultation: Establishing goals of care  HPI/Patient Profile: 75 y.o. male  with past medical history of COPD, diabetes, CHF, ischemic cardiomyopathy, A. fib admitted on 09/17/2020 with weight gain and worsening shortness of breath.  He was found to be in A. fib with RVR and had pneumonia and was subsequently started on Levaquin.  Initially, plan was for TEE/DCCV, however, it was recommended that this be done under mechanical ventilation and general anesthesia.  Patient clear he is a DNR and family requested palliative care discussion for consideration for hospice.  Clinical Assessment and Goals of Care: I met today with Dalton Juarez.  I introduced palliative care as specialized medical care for people living with serious illness. It focuses on providing relief from the symptoms and stress of a serious illness. The goal is to improve quality of life for both the patient and the family.  He is awake and alert and in no distress but tells me he is feeling miserable and frustrated.  He tells me that he feels that he is not improving and while he had a good day yesterday, he tells me is an up-and-down course every day and overall he feels that he is getting worse.  He has cooperative throughout examination but is visibly frustrated by the fact he is not feeling well.  We discussed clinical course as well as wishes moving forward in regard to advanced directives.  Concepts specific to code status and rehospitalization discussed.  We discussed difference between a aggressive medical intervention path and a palliative, comfort focused care path.  Values and goals of care important to patient and family were attempted to be  elicited.  After discussion, he tells me that he and his wife been discussing potential for home with hospice.  He would like for her to be part of the discussion and we talked about a plan to set up a family meeting.  Questions and concerns addressed.   PMT will continue to support holistically.  SUMMARY OF RECOMMENDATIONS   -DNR/DNI -Overall, Dalton Juarez reports that he does not feel he is making significant improvements.  He would like for his wife to be present to discuss goals and plan of care moving forward. -We will work to set up family meeting either this afternoon or tomorrow with his wife present as well.  Code Status/Advance Care Planning: DNR  Palliative Prophylaxis:  Delirium Protocol and Frequent Pain Assessment  Psycho-social/Spiritual:  Desire for further Chaplaincy support:no Additional Recommendations: Education on Hospice  Prognosis:  Guarded  Discharge Planning: To Be Determined      Primary Diagnoses: Present on Admission: **None**   I have reviewed the medical record, interviewed the patient and family, and examined the patient. The following aspects are pertinent.  Past Medical History:  Diagnosis Date   Acute on chronic right-sided  congestive heart failure (HCC)    Anemia in other chronic diseases classified elsewhere    Atrial fibrillation (HCC)    CAD (coronary artery disease)    COPD (chronic obstructive pulmonary disease) (HCC)    Ischemic cardiomyopathy    Seasonal allergic rhinitis due to pollen    Tachypnea    Vitamin D deficiency    Social History   Socioeconomic History   Marital status: Married    Spouse name: Not on file   Number of children: Not on file   Years of education: Not on file   Highest education level: Not on file  Occupational History   Not on file  Tobacco Use   Smoking status: Former    Types: Cigarettes   Smokeless tobacco: Not on file  Substance and Sexual Activity   Alcohol use: Not Currently   Drug  use: Never   Sexual activity: Not on file  Other Topics Concern   Not on file  Social History Narrative   Not on file   Social Determinants of Health   Financial Resource Strain: Not on file  Food Insecurity: Not on file  Transportation Needs: Not on file  Physical Activity: Not on file  Stress: Not on file  Social Connections: Not on file   Family History  Problem Relation Age of Onset   Hypertension Mother    CAD Neg Hx    Heart failure Neg Hx    Stroke Neg Hx    Scheduled Meds:  [START ON 10/05/2020] amiodarone  200 mg Oral BID   [START ON 10/12/2020] amiodarone  200 mg Oral Daily   amiodarone  400 mg Oral BID   apixaban  5 mg Oral BID   atorvastatin  40 mg Oral Daily   bisoprolol  5 mg Oral BID   chlorhexidine  15 mL Mouth Rinse BID   Chlorhexidine Gluconate Cloth  6 each Topical Daily   cholecalciferol  2,000 Units Oral Daily   clopidogrel  75 mg Oral Daily   dorzolamide  1 drop Left Eye BID   furosemide  20 mg Oral Daily   gabapentin  300 mg Oral TID   hydrocortisone  25 mg Rectal BID   insulin aspart  0-15 Units Subcutaneous TID WC   insulin aspart  0-5 Units Subcutaneous QHS   mouth rinse  15 mL Mouth Rinse q12n4p   mometasone-formoterol  2 puff Inhalation BID   multivitamin with minerals  1 tablet Oral Daily   pantoprazole  40 mg Oral Daily   phenylephrine  1 suppository Rectal BID   polyethylene glycol  17 g Oral BID   revefenacin  175 mcg Nebulization Daily   senna-docusate  1 tablet Oral BID   tamsulosin  0.4 mg Oral Daily   Continuous Infusions:  sodium chloride Stopped (09/20/20 2035)   PRN Meds:.sodium chloride, acetaminophen **OR** acetaminophen, albuterol, guaiFENesin-dextromethorphan, ipratropium, metoprolol tartrate, nitroGLYCERIN, witch hazel-glycerin Medications Prior to Admission:  Prior to Admission medications   Medication Sig Start Date End Date Taking? Authorizing Provider  albuterol (PROVENTIL) (2.5 MG/3ML) 0.083% nebulizer solution  Take 2.5 mg by nebulization 3 (three) times daily. 07/11/20  Yes [provider]  albuterol (VENTOLIN HFA) 108 (90 Base) MCG/ACT inhaler Inhale into the lungs. 01/05/16  Yes [provider]  amoxicillin (AMOXIL) 500 MG capsule Take 500 mg by mouth 2 (two) times daily.   Yes [provider]  aspirin 81 MG EC tablet Take by mouth.   Yes [provider]  atorvastatin (LIPITOR) 40 MG tablet Take 40 mg by mouth daily as needed. 09/12/20  Yes [provider]  Cholecalciferol (VITAMIN D3) 50 MCG (2000 UT) TABS Take 1 tablet by mouth daily.   Yes [provider]  clopidogrel (PLAVIX) 75 MG tablet Take 75 mg by mouth daily. 09/12/20  Yes [provider]  dorzolamide (TRUSOPT) 2 % ophthalmic solution Place 1 drop into the left eye 2 (two) times daily. 09/14/20  Yes [provider]  furosemide (LASIX) 20 MG tablet Take 20 mg by mouth daily. 08/24/20  Yes [provider]  gabapentin (NEURONTIN) 300 MG capsule Take 300 mg by mouth 3 (three) times daily. 08/24/20  Yes [provider]  Ipratropium-Albuterol (COMBIVENT RESPIMAT) 20-100 MCG/ACT AERS respimat Inhale 1 puff into the lungs 4 (four) times daily.   Yes [provider]  metFORMIN (GLUCOPHAGE-XR) 500 MG 24 hr tablet Take 500 mg by mouth 2 (two) times daily. 08/24/20  Yes [provider]  metoprolol tartrate (LOPRESSOR) 25 MG tablet Take 25 mg by mouth in the morning, at noon, and at bedtime. 08/24/20  Yes [provider]  Multiple Vitamin (MULTI-VITAMIN) tablet Take 1 tablet by mouth daily.   Yes [provider]  nitroGLYCERIN (NITROSTAT) 0.4 MG SL tablet Place under the tongue. 08/24/20  Yes [provider]  omeprazole (PRILOSEC) 20 MG capsule Take 40 mg by mouth daily. 08/24/20  Yes [provider]  OXYGEN Inhale 2 L into the lungs daily.   Yes [provider]  SYMBICORT 160-4.5 MCG/ACT inhaler Inhale 2 puffs into the  lungs 2 (two) times daily. 08/24/20  Yes [provider]  azithromycin (ZITHROMAX) 250 MG tablet Take 250 mg by mouth See admin instructions. Take 250 mg on Monday, Wednesday and Friday Patient not taking: No sig reported    [provider]  fluticasone (FLONASE) 50 MCG/ACT nasal spray Place 1 spray into both nostrils daily. Patient not taking: No sig reported    [provider]   Allergies  Allergen Reactions   Advair Hfa [Fluticasone-Salmeterol]    Amiodarone Other (See Comments)    Bradycardia   Anoro Ellipta [Umeclidinium-Vilanterol]    Breo Ellipta [Fluticasone Furoate-Vilanterol]    Brovana [Arformoterol]    Budesonide    Entresto [Sacubitril-Valsartan]    Oxycodone Other (See Comments)    Required Narcan gtt after 10 of oxycodone for obtundation   Spiriva Respimat [Tiotropium Bromide Monohydrate]    Review of Systems  Constitutional:  Positive for activity change, fatigue and unexpected weight change.  Respiratory:  Positive for cough, shortness of breath and wheezing.   Psychiatric/Behavioral:  Positive for sleep disturbance.    Physical Exam General: Alert, awake, in no acute distress.   HEENT: No bruits, no goiter, no JVD Heart: Rate controlled. No murmur appreciated. Lungs: Fair air movement, scattered ronchi Abdomen: Soft, nontender, nondistended, positive bowel sounds.   Ext: +edema Skin: Warm and dry Neuro: Grossly intact, nonfocal.   Vital Signs: BP 131/79   Pulse 95   Temp 97.8 F (36.6 C) (Axillary)   Resp (!) 22   Ht $R'5\' 7"'fw$  (1.702 m)   Wt 99.7 kg   SpO2 100%   BMI 34.43 kg/m  Pain Scale: 0-10   Pain Score: Asleep   SpO2: SpO2: 100 % O2 Device:SpO2: 100 % O2 Flow Rate: .O2 Flow Rate (L/min): 4 L/min  IO: Intake/output summary:  Intake/Output Summary (Last 24 hours) at 09/30/2020 1030 Last data filed at 09/30/2020 0500 Gross per 24  hour  Intake 200.06 ml  Output 900 ml  Net -699.94 ml    LBM: Last BM Date:  09/29/20 Baseline Weight: Weight: 99.8 kg Most recent weight: Weight: 99.7 kg     Palliative Assessment/Data:   Flowsheet Rows    Flowsheet Row Most Recent Value  Intake Tab   Referral Department Hospitalist  Unit at Time of Referral ICU  Palliative Care Primary Diagnosis Cardiac  Date Notified 09/28/20  Palliative Care Type New Palliative care  Reason for referral Clarify Goals of Care  Date of Admission 09/17/20  Date first seen by Palliative Care 09/29/20  # of days Palliative referral response time 1 Day(s)  # of days IP prior to Palliative referral 11  Clinical Assessment   Palliative Performance Scale Score 30%  Psychosocial & Spiritual Assessment   Palliative Care Outcomes   Patient/Family meeting held? Yes  Who was at the meeting? patient       Time In: 1100 Time Out: 1200 Time Total: 60 Greater than 50%  of this time was spent counseling and coordinating care related to the above assessment and plan.  Signed by: Micheline Rough, MD   Please contact Palliative Medicine Team phone at [phone] for questions and concerns.  For individual provider: See Shea Evans

## 2020-10-01 LAB — COMPREHENSIVE METABOLIC PANEL
ALT: 39 U/L (ref 0–44)
AST: 27 U/L (ref 15–41)
Albumin: 3.2 g/dL — ABNORMAL LOW (ref 3.5–5.0)
Alkaline Phosphatase: 72 U/L (ref 38–126)
Anion gap: 8 (ref 5–15)
BUN: 35 mg/dL — ABNORMAL HIGH (ref 8–23)
CO2: 37 mmol/L — ABNORMAL HIGH (ref 22–32)
Calcium: 9.1 mg/dL (ref 8.9–10.3)
Chloride: 93 mmol/L — ABNORMAL LOW (ref 98–111)
Creatinine, Ser: 1.02 mg/dL (ref 0.61–1.24)
GFR, Estimated: >60 mL/min (ref >60)
Glucose, Bld: 114 mg/dL — ABNORMAL HIGH (ref 70–99)
Potassium: 4.2 mmol/L (ref 3.5–5.1)
Sodium: 138 mmol/L (ref 135–145)
Total Bilirubin: 1.1 mg/dL (ref 0.3–1.2)
Total Protein: 6.6 g/dL (ref 6.5–8.1)

## 2020-10-01 LAB — GLUCOSE, CAPILLARY
Glucose-Capillary: 121 mg/dL — ABNORMAL HIGH (ref 70–99)
Glucose-Capillary: 144 mg/dL — ABNORMAL HIGH (ref 70–99)
Glucose-Capillary: 115 mg/dL — ABNORMAL HIGH (ref 70–99)
Glucose-Capillary: 124 mg/dL — ABNORMAL HIGH (ref 70–99)

## 2020-10-01 MED ORDER — AMIODARONE HCL 200 MG PO TABS: 200.0000 mg | ORAL_TABLET | Freq: Every day | ORAL | 0 refills | Status: AC

## 2020-10-01 MED ORDER — TAMSULOSIN HCL 0.4 MG PO CAPS: 0.4000 mg | ORAL_CAPSULE | Freq: Every day | ORAL | 0 refills | Status: AC

## 2020-10-01 MED ORDER — APIXABAN 5 MG PO TABS: 5.0000 mg | ORAL_TABLET | Freq: Two times a day (BID) | ORAL | 0 refills | Status: AC

## 2020-10-01 MED ORDER — BISOPROLOL FUMARATE 5 MG PO TABS: 5.0000 mg | ORAL_TABLET | Freq: Two times a day (BID) | ORAL | 0 refills | Status: AC

## 2020-10-01 MED ORDER — OXYCODONE HCL 5 MG PO TABS: 5.0000 mg | ORAL_TABLET | ORAL | 0 refills | Status: AC | PRN

## 2020-10-01 MED ORDER — AMIODARONE HCL 400 MG PO TABS: 400.0000 mg | ORAL_TABLET | Freq: Two times a day (BID) | ORAL | 0 refills | Status: AC

## 2020-10-01 MED ORDER — AMIODARONE HCL 200 MG PO TABS: 200.0000 mg | ORAL_TABLET | Freq: Two times a day (BID) | ORAL | 0 refills | Status: AC

## 2020-10-01 NOTE — Progress Notes (Signed)
[facility] here to pick up patient, wife notified, [facility] notified. All night meds given. AVS completed.

## 2020-10-01 NOTE — Discharge Summary (Signed)
Physician Discharge Summary  Dalton Juarez NWG:[id number] DOB: Nov 13, 1945 DOA: 09/17/2020  PCP: Dalton Robert, MD  Admit date: 09/17/2020 Discharge date: 10/01/2020  Admitted From: Home Disposition:  Home  Recommendations for Outpatient Follow-up:  Follow up with PCP in 1-2 weeks Follow up with hospice services  Discharge Condition:Stable CODE STATUS:DNR Diet recommendation: Diabetic heart healthy   Brief/Interim Summary: 75 year old male with history of COPD on 2 L/min of oxygen via nasal cannula, diabetes mellitus type 2, CHF/ischemic cardiomyopathy, atrial fibrillation presented to ED with 2 to 3 weeks history of progressively worsening shortness of breath, 10 pound weight gain. In the ED he was found to be tachycardic, with A. fib with RVR.  Cardizem drip was started.  Chest x-ray showed bilateral patchy opacity was placed on Levaquin.  Cardiology was consulted.  Initially cardiology had planned for TEE/DCCV.  Pulmonology was consulted for clearance however PCCM recommended mechanical ventilation and intubation and TEE/DCCV under general anesthesia.  Patient has refused mechanical ventilation, he is DNR.  Family requested palliative care for discussion regarding hospice.  Palliative care has been consulted.  Discharge Diagnoses:  Principal Problem:   CHF exacerbation (HCC) Active Problems:   CAD (coronary artery disease)   Atrial fibrillation with RVR (HCC)   COPD with acute exacerbation (HCC)  Acute on chronic combined systolic and diastolic CHF -Presented with BNP of 445; echo shows EF 49% -Started on Lasix 80 mg IV 3 times daily -Net -17.7 liters by time of d/c -Cardiology recommendations VHQ:IONG to 400mg  BID PO x 1 week then 200mg  BID for 1 week and then 200mg  daily, Apixaban 5mg  BID, Atorvastatin 40mg  daily, Bisoprolol 5mg  BID, Plavix 75mg  daily, Lasix 20mg  daily -Cardiology has since signed off   Atrial fibrillation with RVR -Heart rate is controlled -Started on  Lopressor 50 mg 3 times daily, switch to bisoprolol 5 mg p.o. twice daily  -Cardizem was stopped by cardiology -Transitioned to p.o. amiodarone from  IV amiodarone -TSH is normal -Started on Eliquis for anticoagulation -Cardiology had considered TEE guided cardioversion, pulmonology was consulted.  Pt was felt not a candidate for cardioversion given his cardiopulmonary status at baseline -Palliative care was consulted with plan to discharge home with hospice   Positive troponin -Mild elevation of troponin.  35>> 32 -Likely demand ischemia -No ischemic work-up planned per cardiology -He was on DAPT with aspirin and Plavix at home -Aspirin stopped as patient is on Eliquis   Hypertension -Blood pressure is labile -Cardiology had stopped Cardizem due to soft BP -Blood pressure stable at present   COPD exacerbation -Continue DuoNeb nebulizer, Solu-Medrol -Added flutter valve every 4 hours -Continue Mucinex, prednisone 40 mg daily -Was recently started on Mucomyst nebulizer every 4 hours for 4 treatments -Chest PT was ordered every 4 hours   Acute kidney injury -In setting of diuresis, renal function remained stable   Diabetes mellitus type 2 -Continue sliding scale insulin with NovoLog -CBG remained stable    Hemorrhoids -Anusol HC suppository twice a day   Constipation -Recommend continuing cathartics as needed   Urinary retention -Started on Flomax 0.4 mg daily -Had multiple episodes requiring in and out cath -Foley catheter inserted on 09/27/2020 -Foley subsequently removed 8/24   Leukocytosis -WBC has been elevated;afebrile, no signs of infection -Likely from steroids   Hypokalemia -replaced   Right-sided facial droop -CT head was negative for stroke -No focal neurological deficit -Appearance of right-sided facial droop could have been due to mask of BiPAP -Noted to have resolved   Goals of  care -Appreciate assistance by palliative care -Plan for d/c home  with hospice    Discharge Instructions   Allergies as of 10/01/2020       Reactions   Advair Hfa [fluticasone-salmeterol]    Amiodarone Other (See Comments)   Bradycardia   Anoro Ellipta [umeclidinium-vilanterol]    Breo Ellipta [fluticasone Furoate-vilanterol]    Brovana [arformoterol]    Budesonide    Entresto [sacubitril-valsartan]    Oxycodone Other (See Comments)   Required Narcan gtt after 10 of oxycodone for obtundation   Spiriva Respimat [tiotropium Bromide Monohydrate]         Medication List     STOP taking these medications    amoxicillin 500 MG capsule Commonly known as: AMOXIL   aspirin 81 MG EC tablet   azithromycin 250 MG tablet Commonly known as: ZITHROMAX   fluticasone 50 MCG/ACT nasal spray Commonly known as: FLONASE   metoprolol tartrate 25 MG tablet Commonly known as: LOPRESSOR       TAKE these medications    albuterol 108 (90 Base) MCG/ACT inhaler Commonly known as: VENTOLIN HFA Inhale into the lungs.   albuterol (2.5 MG/3ML) 0.083% nebulizer solution Commonly known as: PROVENTIL Take 2.5 mg by nebulization 3 (three) times daily.   amiodarone 400 MG tablet Commonly known as: PACERONE Take 1 tablet (400 mg total) by mouth 2 (two) times daily for 3 days.   amiodarone 200 MG tablet Commonly known as: PACERONE Take 1 tablet (200 mg total) by mouth 2 (two) times daily for 7 days. Start taking on: October 05, 2020   amiodarone 200 MG tablet Commonly known as: PACERONE Take 1 tablet (200 mg total) by mouth daily. Start taking on: October 12, 2020   apixaban 5 MG Tabs tablet Commonly known as: ELIQUIS Take 1 tablet (5 mg total) by mouth 2 (two) times daily.   atorvastatin 40 MG tablet Commonly known as: LIPITOR Take 40 mg by mouth daily as needed.   bisoprolol 5 MG tablet Commonly known as: ZEBETA Take 1 tablet (5 mg total) by mouth 2 (two) times daily.   clopidogrel 75 MG tablet Commonly known as: PLAVIX Take 75 mg by  mouth daily.   Combivent Respimat 20-100 MCG/ACT Aers respimat Generic drug: Ipratropium-Albuterol Inhale 1 puff into the lungs 4 (four) times daily.   dorzolamide 2 % ophthalmic solution Commonly known as: TRUSOPT Place 1 drop into the left eye 2 (two) times daily.   furosemide 20 MG tablet Commonly known as: LASIX Take 20 mg by mouth daily.   gabapentin 300 MG capsule Commonly known as: NEURONTIN Take 300 mg by mouth 3 (three) times daily.   metFORMIN 500 MG 24 hr tablet Commonly known as: GLUCOPHAGE-XR Take 500 mg by mouth 2 (two) times daily.   Multi-Vitamin tablet Take 1 tablet by mouth daily.   nitroGLYCERIN 0.4 MG SL tablet Commonly known as: NITROSTAT Place under the tongue.   omeprazole 20 MG capsule Commonly known as: PRILOSEC Take 40 mg by mouth daily.   oxyCODONE 5 MG immediate release tablet Commonly known as: Oxy IR/ROXICODONE Take 1 tablet (5 mg total) by mouth every 4 (four) hours as needed for severe pain.   OXYGEN Inhale 2 L into the lungs daily.   Symbicort 160-4.5 MCG/ACT inhaler Generic drug: budesonide-formoterol Inhale 2 puffs into the lungs 2 (two) times daily.   tamsulosin 0.4 MG Caps capsule Commonly known as: FLOMAX Take 1 capsule (0.4 mg total) by mouth daily. Start taking on: October 02, 2020  Vitamin D3 50 MCG (2000 UT) Tabs Take 1 tablet by mouth daily.        Follow-up Information     Croitoru, Mihai, MD Follow up.   Specialty: Cardiology Why: office will call with date and time of appointment Contact information: [address] [place] [address] [phone]         Dalton Robert, MD Follow up.   Specialty: Family Medicine Why: As needed Contact information: [address] [place] [address] [phone]         Thurmon Fair, MD .   Specialty: Cardiology Contact information: [address] [place] [address] [phone]                Allergies   Allergen Reactions   Advair Hfa [Fluticasone-Salmeterol]    Amiodarone Other (See Comments)    Bradycardia   Anoro Ellipta [Umeclidinium-Vilanterol]    Breo Ellipta [Fluticasone Furoate-Vilanterol]    Brovana [Arformoterol]    Budesonide    Entresto [Sacubitril-Valsartan]    Oxycodone Other (See Comments)    Required Narcan gtt after 10 of oxycodone for obtundation   Spiriva Respimat [Tiotropium Bromide Monohydrate]     Consultations: Cardiology PCCM Palliative Care  Procedures/Studies: DG Chest 1 View  Result Date: 09/22/2020 CLINICAL DATA:  Shortness of breath EXAM: CHEST  1 VIEW COMPARISON:  09/19/2020 FINDINGS: Stable cardiomegaly. Persistent streaky opacities at the right lung base with blunting of the right costophrenic angle. No significant interval change from prior. No new airspace opacity. No pneumothorax. IMPRESSION: Persistent streaky opacities at the right lung base with blunting of the right costophrenic angle, not significantly changed from prior. Electronically Signed   By: Duanne Guess D.O.   On: 09/22/2020 08:08   US RENAL  Result Date: 09/19/2020 CLINICAL DATA:  Acute kidney injury. EXAM: RENAL / URINARY TRACT ULTRASOUND COMPLETE COMPARISON:  None. FINDINGS: Right Kidney: Renal measurements: 10.2 x 5.0 x 5.4 cm = volume: 143 mL. Normal parenchymal echogenicity. No hydronephrosis. No visualized stone or focal lesion. Left Kidney: Renal measurements: 10.2 x 5.0 x 5.3 cm = volume: 140 mL. Normal parenchymal echogenicity. No hydronephrosis. No visualized stone or focal lesion. Bladder: Partially distended. Appears normal for degree of bladder distention. Other: Technically challenging and limited exam due to habitus and patient difficulty with breath hold. Incidental note of right upper quadrant ascites. The liver appears echogenic with questionable nodular contours. IMPRESSION: 1. No obstructive uropathy. Unremarkable sonographic appearance of the kidneys. 2.  Incidental findings of right upper quadrant ascites. Increased liver echogenicity and questionable capsular nodularity, query cirrhosis. Recommend correlation with clinical history and cirrhosis risk factors. This is incompletely characterized on this renal exam. Electronically Signed   By: Narda Rutherford M.D.   On: 09/19/2020 16:39   DG CHEST PORT 1 VIEW  Result Date: 09/19/2020 CLINICAL DATA:  Pneumonia.  Increased shortness of breath. EXAM: PORTABLE CHEST 1 VIEW COMPARISON:  Radiograph yesterday. FINDINGS: Unchanged cardiomegaly. Stable mediastinal contours. No significant change in streaky opacity at the right lung base and blunting of the costophrenic angle. No new airspace disease. There is mild peribronchial thickening. No pneumothorax. Stable osseous structures. IMPRESSION: 1. No significant change in streaky opacity at the right lung base and blunting of the costophrenic angle. Findings may represent pneumonia or underlying scarring. 2. Stable cardiomegaly. 3. Peribronchial thickening may be bronchitic or congestive. Electronically Signed   By: Narda Rutherford M.D.   On: 09/19/2020 16:37   DG Chest [facility]  Result Date: 09/18/2020 CLINICAL DATA:  Increased shortness of breath. EXAM: PORTABLE CHEST 1 VIEW COMPARISON:  09/17/2020 FINDINGS: Again noted is enlargement of the cardiac silhouette. There is volume loss in the right lower chest with streaky densities at the right lung base probably represent areas of atelectasis or scarring. Additional streaky densities in the right mid lung are similar to the recent comparison examination. No overt pulmonary edema. Trachea is midline. Negative for a pneumothorax. IMPRESSION: Persistent volume loss in the right lower chest with streaky densities in the mid and lower right lung. Findings are similar to the comparison exam on 09/17/2020. Suspect there is a chronic component to these right lung densities. Acute on chronic disease cannot be excluded.  Stable cardiomegaly. Electronically Signed   By: Richarda Overlie M.D.   On: 09/18/2020 10:28   DG Chest Port 1 View  Result Date: 09/17/2020 CLINICAL DATA:  Shortness of breath, history of COPD and CHF EXAM: PORTABLE CHEST 1 VIEW COMPARISON:  None. FINDINGS: The heart is mildly enlarged. The mediastinal contours are within normal limits. There are patchy and reticular opacities throughout the right lung. There is a small right pleural effusion layering along the chest wall. The left lung is clear. There is no pneumothorax. There is no acute osseous abnormality. IMPRESSION: 1. Patchy opacities throughout the right lung with an associated small pleural effusion may reflect pneumonia in the correct clinical setting. Recommend follow-up radiographs in 6-8 weeks to assess for resolution. 2. Mild cardiomegaly. Electronically Signed   By: Lesia Hausen MD   On: 09/17/2020 13:53   ECHOCARDIOGRAM COMPLETE  Result Date: 09/18/2020    ECHOCARDIOGRAM REPORT   Patient Name:   Dalton Juarez Date of Exam: 09/18/2020 Medical Rec #:  [id number]     Height:       67.0 in Accession #:    [id number]    Weight:       230.4 lb Date of Birth:  January 07, 1946     BSA:          2.148 m Patient Age:    75 years      BP:           115/67 mmHg Patient Gender: M             HR:           107 bpm. Exam Location:  Inpatient Procedure: 2D Echo, 3D Echo, Cardiac Doppler and Color Doppler Indications:    I50.40* Unspecified combined systolic (congestive) and diastolic                 (congestive) heart failure  History:        Patient has no prior history of Echocardiogram examinations. CHF                 and Cardiomyopathy, CAD, Abnormal ECG, COPD, Arrythmias:Atrial                 Fibrillation, Tachycardia and RBBB; Risk Factors:Former Smoker,                 Hypertension, Dyslipidemia and Diabetes.  Sonographer:    Sheralyn Boatman RDCS Referring Phys: [id number] Shamieka Gullo K Nylen Creque  Sonographer Comments: Technically difficult study due to poor echo windows. Image  acquisition challenging due to patient body habitus. IMPRESSIONS  1. Left ventricular ejection fraction by 3D volume is 49 %. The left ventricle has mildly decreased function. The left ventricle has no regional wall motion abnormalities. There is mild left ventricular  hypertrophy. Left ventricular diastolic parameters  are indeterminate.  2. Right ventricular systolic function is normal. The right ventricular size is normal. There is mildly elevated pulmonary artery systolic pressure. The estimated right ventricular systolic pressure is 40.5 mmHg.  3. Right atrial size was moderately dilated.  4. The mitral valve is normal in structure. Moderate mitral valve regurgitation. No evidence of mitral stenosis.  5. The aortic valve is normal in structure. Aortic valve regurgitation is not visualized. Mild to moderate aortic valve sclerosis/calcification is present, without any evidence of aortic stenosis.  6. The inferior vena cava is normal in size with greater than 50% respiratory variability, suggesting right atrial pressure of 3 mmHg. FINDINGS  Left Ventricle: Left ventricular ejection fraction by 3D volume is 49 %. The left ventricle has mildly decreased function. The left ventricle has no regional wall motion abnormalities. The left ventricular internal cavity size was normal in size. There is mild left ventricular hypertrophy. Left ventricular diastolic parameters are indeterminate.  LV Wall Scoring: The basal inferior segment is akinetic. Right Ventricle: The right ventricular size is normal. No increase in right ventricular wall thickness. Right ventricular systolic function is normal. There is mildly elevated pulmonary artery systolic pressure. The tricuspid regurgitant velocity is 2.85  m/s, and with an assumed right atrial pressure of 8 mmHg, the estimated right ventricular systolic pressure is 40.5 mmHg. Left Atrium: Left atrial size was normal in size. Right Atrium: Right atrial size was moderately dilated.  Pericardium: There is no evidence of pericardial effusion. Mitral Valve: The mitral valve is normal in structure. Mild mitral annular calcification. Moderate mitral valve regurgitation, with eccentric laterally directed jet. No evidence of mitral valve stenosis. Tricuspid Valve: The tricuspid valve is normal in structure. Tricuspid valve regurgitation is mild . No evidence of tricuspid stenosis. Aortic Valve: The aortic valve is normal in structure. Aortic valve regurgitation is not visualized. Mild to moderate aortic valve sclerosis/calcification is present, without any evidence of aortic stenosis. Pulmonic Valve: The pulmonic valve was normal in structure. Pulmonic valve regurgitation is not visualized. No evidence of pulmonic stenosis. Aorta: The aortic root is normal in size and structure. Venous: The inferior vena cava is normal in size with greater than 50% respiratory variability, suggesting right atrial pressure of 3 mmHg. IAS/Shunts: No atrial level shunt detected by color flow Doppler.  LEFT VENTRICLE PLAX 2D LVIDd:         5.30 cm LVIDs:         4.10 cm LV PW:         1.30 cm         3D Volume EF LV IVS:        1.20 cm         LV 3D EF:    Left                                             ventricul                                             ar LV Volumes (MOD)                            ejection LV vol d,  MOD    97.6 ml                    fraction A2C:                                        by 3D LV vol d, MOD    77.1 ml                    volume is A4C:                                        49 %. LV vol s, MOD    63.5 ml A2C: LV vol s, MOD    42.7 ml       3D Volume EF: A4C:                           3D EF:        49 % LV SV MOD A2C:   34.1 ml       LV EDV:       119 ml LV SV MOD A4C:   77.1 ml       LV ESV:       61 ml LV SV MOD BP:    38.0 ml       LV SV:        59 ml RIGHT VENTRICLE            IVC RV S prime:     6.31 cm/s  IVC diam: 2.50 cm TAPSE (M-mode): 1.1 cm LEFT ATRIUM             Index        RIGHT ATRIUM           Index LA diam:        4.20 cm 1.96 cm/m  RA Area:     23.30 cm LA Vol (A2C):   60.5 ml 28.17 ml/m RA Volume:   73.50 ml  34.23 ml/m LA Vol (A4C):   42.9 ml 19.98 ml/m LA Biplane Vol: 51.4 ml 23.93 ml/m  AORTIC VALVE LVOT Vmax:   121.00 cm/s LVOT Vmean:  75.300 cm/s LVOT VTI:    0.179 m  AORTA Ao Root diam: 3.20 cm Ao Asc diam:  2.80 cm MITRAL VALVE                 TRICUSPID VALVE MV Area (PHT): 3.86 cm      TR Peak grad:   32.5 mmHg MV Decel Time: 196 msec      TR Vmax:        285.00 cm/s MR Peak grad:    91.0 mmHg MR Mean grad:    58.0 mmHg   SHUNTS MR Vmax:         477.00 cm/s Systemic VTI: 0.18 m MR Vmean:        363.0 cm/s MR PISA:         1.01 cm MR PISA Eff ROA: 6 mm MR PISA Radius:  0.40 cm MV E velocity: 130.33 cm/s Donato Schultz MD Electronically signed by Donato Schultz MD Signature Date/Time: 09/18/2020/3:45:00 PM    Final    CT HEAD CODE STROKE WO CONTRAST  Result Date: 09/25/2020  CLINICAL DATA:  Code stroke. Acute onset left pupil change, right facial droop EXAM: CT HEAD WITHOUT CONTRAST TECHNIQUE: Contiguous axial images were obtained from the base of the skull through the vertex without intravenous contrast. COMPARISON:  None. FINDINGS: Brain: There is no evidence of acute intracranial hemorrhage, extra-axial fluid collection, or infarct. There is mild parenchymal volume loss with commensurate enlargement of the ventricular system. Foci of hypodensity in the subcortical and periventricular white matter likely reflect mild chronic white matter microangiopathy. There is no mass lesion. There is no midline shift. Vascular: There is calcification of the bilateral cavernous ICAs. Skull: Normal. Negative for fracture or focal lesion. Sinuses/Orbits: The imaged paranasal sinuses are clear. The patient is status post right lens implant. The globes and orbits are otherwise unremarkable. Other: There is a remote right nasal bone fracture. ASPECTS [facility] Stroke Program Early CT  Score) - Ganglionic level infarction (caudate, lentiform nuclei, internal capsule, insula, M1-M3 cortex): 3 - Supraganglionic infarction (M4-M6 cortex): 7 Total score (0-10 with 10 being normal): 10 IMPRESSION: 1. No acute intracranial pathology 2. ASPECTS is 10 3. Mild parenchymal volume loss and chronic white matter microangiopathy. These results were called by telephone at the time of interpretation on 09/25/2020 at 10:08 am to provider [facility] , who verbally acknowledged these results. Electronically Signed   By: Lesia Hausen M.D.   On: 09/25/2020 10:09    Subjective: Eager to go home  Discharge Exam: Vitals:   10/01/20 1100 10/01/20 1200  BP: (!) 111/42 119/60  Pulse: 89 85  Resp: 15 (!) 22  Temp:    SpO2: 98% 99%   Vitals:   10/01/20 0900 10/01/20 1000 10/01/20 1100 10/01/20 1200  BP: (!) 142/84 110/60 (!) 111/42 119/60  Pulse: 83 89 89 85  Resp: (!) 28 (!) 21 15 (!) 22  Temp:      TempSrc:      SpO2: 99% 98% 98% 99%  Weight:      Height:        General: Pt is alert, awake, not in acute distress Cardiovascular: RRR, S1/S2 + Respiratory: CTA bilaterally, no wheezing, no rhonchi Abdominal: Soft, NT, ND, bowel sounds + Extremities: no edema, no cyanosis   The results of significant diagnostics from this hospitalization (including imaging, microbiology, ancillary and laboratory) are listed below for reference.     Microbiology: No results found for this or any previous visit (from the past 240 hour(s)).   Labs: BNP (last 3 results) Recent Labs    09/17/20 1314  BNP 445.0*   Basic Metabolic Panel: Recent Labs  Lab 09/27/20 0306 09/28/20 0305 09/29/20 0256 09/30/20 0316 10/01/20 0833  NA 139 141 138 137 138  K 4.7 3.7 3.1* 3.4* 4.2  CL 88* 90* 89* 92* 93*  CO2 41* 39* 37* 36* 37*  GLUCOSE 129* 141* 114* 107* 114*  BUN 41* 39* 42* 37* 35*  CREATININE 1.13 1.12 1.18 1.12 1.02  CALCIUM 9.0 9.2 9.1 8.9 9.1   Liver Function Tests: Recent Labs  Lab  09/30/20 0316 10/01/20 0833  AST 23 27  ALT 35 39  ALKPHOS 66 72  BILITOT 1.3* 1.1  PROT 6.1* 6.6  ALBUMIN 3.1* 3.2*   No results for input(s): LIPASE, AMYLASE in the last 168 hours. No results for input(s): AMMONIA in the last 168 hours. CBC: Recent Labs  Lab 09/26/20 0242 09/27/20 0306 09/29/20 0256 09/30/20 0316  WBC 17.7* 16.1* 14.9* 17.4*  HGB 11.8* 12.3* 12.1* 11.6*  HCT 38.6* 40.2  39.0 37.7*  MCV 88.9 89.7 88.4 88.3  PLT 334 224 304 267   Cardiac Enzymes: No results for input(s): CKTOTAL, CKMB, CKMBINDEX, TROPONINI in the last 168 hours. BNP: Invalid input(s): POCBNP CBG: Recent Labs  Lab 09/30/20 1250 09/30/20 1637 09/30/20 2020 10/01/20 0801 10/01/20 1316  GLUCAP 135* 196* 175* 121* 144*   D-Dimer No results for input(s): DDIMER in the last 72 hours. Hgb A1c No results for input(s): HGBA1C in the last 72 hours. Lipid Profile No results for input(s): CHOL, HDL, LDLCALC, TRIG, CHOLHDL, LDLDIRECT in the last 72 hours. Thyroid function studies No results for input(s): TSH, T4TOTAL, T3FREE, THYROIDAB in the last 72 hours.  Invalid input(s): FREET3 Anemia work up No results for input(s): VITAMINB12, FOLATE, FERRITIN, TIBC, IRON, RETICCTPCT in the last 72 hours. Urinalysis No results found for: COLORURINE, APPEARANCEUR, LABSPEC, PHURINE, GLUCOSEU, HGBUR, BILIRUBINUR, KETONESUR, PROTEINUR, UROBILINOGEN, NITRITE, LEUKOCYTESUR Sepsis Labs Invalid input(s): PROCALCITONIN,  WBC,  LACTICIDVEN Microbiology No results found for this or any previous visit (from the past 240 hour(s)).   SIGNED:   Rickey Barbara, MD  [facility] 10/01/2020, 2:36 PM  If 7PM-7AM, please contact night-coverage

## 2020-10-01 NOTE — Progress Notes (Signed)
CPT on hold pt on bedside potty. Pt on BIPAP at this time for rest. Pt placed on BIPAP by RN.

## 2020-10-01 NOTE — Progress Notes (Signed)
Civil engineer, contracting [facility]) Hospital Liaison: RN note     Notified by Transition of Care Manger of patient/family request for [facility] services at home after discharge. Chart and patient information under review by [facility] physician. Hospice eligibility pending currently.     Writer spoke with pt's wife, Dalton Juarez, to initiate education related to hospice philosophy, services and team approach to care.           verbalized understanding of information given. Per discussion, plan is for discharge home today via PTAR once DME is delivered.    Please send signed and completed DNR form home with patient/family.   Please provide prescriptions for mediations at discharge to ensure comfort until patient can be admitted onto hospice services. Dalton Juarez asks about pt being discharged with meds; TOC Rhonda made aware of this question.  DME needs have been discussed.  Patient currently has the following equipment in the home:  walker and wheelchair.  Pt has CPAP and O2 through [employer], but these will need to be picked up. Linda verbalizes understanding. Patient/family requests the following DME for delivery to the home:  hospital bed, OBT, 3N1, CPAP, O2@4L       [facility] equipment manager has been notified and will contact DME provider to arrange STAT delivery to the home. Home address has been verified and is correct in the chart.     Dalton Juarez is the family member to contact to arrange time of delivery.      [facility] aware of the above. Please notify [facility] when patient is ready to leave the unit at discharge. (Call [phone] or [phone] after 5pm.)  [facility] information and contact numbers given to pt's wife Dalton Juarez.       Please do not hesitate to call with questions.   Thank you for the opportunity to participate in this patient's care.  Gillian Scarce, BSN, RN       [facility] Liaison (listed on [facility] under Hospice /[phone]  [phone] (24h on call)

## 2020-10-01 NOTE — TOC Transition Note (Signed)
Transition of Care [facility]) - CM/SW Discharge Note   Patient Details  Name: Dalton Juarez MRN: [id number] Date of Birth: [birth year]  Transition of Care [facility]) CM/SW Contact:  Golda Acre, RN Phone Number: 10/01/2020, 2:30 PM   Clinical Narrative:    Per the authro care bed and equipment should be in place between 3-4pm ptar called for pick up to go home with o2 at 4l/min, at 1800. Rn on floor aware.   Final next level of care: Home w Hospice Care Barriers to Discharge: No Barriers Identified   Patient Goals and CMS Choice Patient states their goals for this hospitalization and ongoing recovery are:: wants to return home with hospice CMS Medicare.gov Compare Post Acute Care list provided to:: Patient Choice offered to / list presented to : Patient  Discharge Placement                       Discharge Plan and Services   Discharge Planning Services: CM Consult Post Acute Care Choice: Hospice                               Social Determinants of Health (SDOH) Interventions     Readmission Risk Interventions Readmission Risk Prevention Plan 09/30/2020  Transportation Screening Complete  Home Care Screening Complete  Medication Review (RN CM) Complete

## 2020-10-01 NOTE — Progress Notes (Signed)
Daily Progress Note   Patient Name: Dalton Juarez       Date: 10/01/2020 DOB: 19-Nov-1945  Age: 75 y.o. MRN#: [id number] Attending Physician: Jerald Kief, MD Primary Care Physician: Smith Robert, MD Admit Date: 09/17/2020  Reason for Consultation/Follow-up: Establishing goals of care  Subjective: I saw and examined Dalton Juarez today.  He was lying in bed in no distress.  He tells me that he is ready to go home and that is his only desire.  We discussed that we are working to arrange home hospice services and he told me, "that is what I hear.  I just hope it happens."  Discussed with hospice liaison as well as TOC team.  Hopeful that equipment can be delivered and he can transition home later today versus tomorrow morning.   Questions and concerns addressed.   PMT will continue to support holistically.   Length of Stay: 14  Current Medications: Scheduled Meds:   [START ON 10/05/2020] amiodarone  200 mg Oral BID   [START ON 10/12/2020] amiodarone  200 mg Oral Daily   amiodarone  400 mg Oral BID   apixaban  5 mg Oral BID   atorvastatin  40 mg Oral Daily   bisoprolol  5 mg Oral BID   chlorhexidine  15 mL Mouth Rinse BID   Chlorhexidine Gluconate Cloth  6 each Topical Daily   cholecalciferol  2,000 Units Oral Daily   clopidogrel  75 mg Oral Daily   dorzolamide  1 drop Left Eye BID   furosemide  20 mg Oral Daily   gabapentin  300 mg Oral TID   hydrocortisone  25 mg Rectal BID   insulin aspart  0-15 Units Subcutaneous TID WC   insulin aspart  0-5 Units Subcutaneous QHS   mouth rinse  15 mL Mouth Rinse q12n4p   mometasone-formoterol  2 puff Inhalation BID   multivitamin with minerals  1 tablet Oral Daily   pantoprazole  40 mg Oral Daily   phenylephrine  1 suppository Rectal BID    polyethylene glycol  17 g Oral BID   revefenacin  175 mcg Nebulization Daily   senna-docusate  1 tablet Oral BID   tamsulosin  0.4 mg Oral Daily    Continuous Infusions:  sodium chloride Stopped (09/20/20 2035)    PRN Meds: sodium chloride,  acetaminophen **OR** acetaminophen, albuterol, guaiFENesin-dextromethorphan, ipratropium, metoprolol tartrate, nitroGLYCERIN, witch hazel-glycerin  Physical Exam   General: Alert, awake, in no acute distress.   HEENT: No bruits, no goiter, no JVD Heart: Rate controlled. No murmur appreciated. Lungs: Fair air movement, scattered ronchi Abdomen: Soft, nontender, nondistended, positive bowel sounds.   Ext: +edema Skin: Warm and dry Neuro: Grossly intact, nonfocal.   Vital Signs: BP (!) 104/58   Pulse 83   Temp (!) 97.4 F (36.3 C) (Axillary)   Resp 20   Ht 5\' 7"  (1.702 m)   Wt 97.6 kg   SpO2 97%   BMI 33.70 kg/m  SpO2: SpO2: 97 % O2 Device: O2 Device: Bi-PAP O2 Flow Rate: O2 Flow Rate (L/min): 4 L/min  Intake/output summary:  Intake/Output Summary (Last 24 hours) at 10/01/2020 1848 Last data filed at 10/01/2020 0500 Gross per 24 hour  Intake 240 ml  Output 275 ml  Net -35 ml    LBM: Last BM Date: 10/01/20 Baseline Weight: Weight: 99.8 kg Most recent weight: Weight: 97.6 kg       Palliative Assessment/Data:    Flowsheet Rows    Flowsheet Row Most Recent Value  Intake Tab   Referral Department Hospitalist  Unit at Time of Referral ICU  Palliative Care Primary Diagnosis Cardiac  Date Notified 09/28/20  Palliative Care Type New Palliative care  Reason for referral Clarify Goals of Care  Date of Admission 09/17/20  Date first seen by Palliative Care 09/29/20  # of days Palliative referral response time 1 Day(s)  # of days IP prior to Palliative referral 11  Clinical Assessment   Palliative Performance Scale Score 30%  Psychosocial & Spiritual Assessment   Palliative Care Outcomes   Patient/Family meeting held? Yes   Who was at the meeting? patient       Patient Active Problem List   Diagnosis Date Noted   CHF exacerbation (HCC) 09/17/2020   CAD (coronary artery disease) 09/17/2020   Atrial fibrillation with RVR (HCC) 09/17/2020   COPD with acute exacerbation (HCC) 09/17/2020    Palliative Care Assessment & Plan   Patient Profile: 75 y.o. male  with past medical history of COPD, diabetes, CHF, ischemic cardiomyopathy, A. fib admitted on 09/17/2020 with weight gain and worsening shortness of breath.  He was found to be in A. fib with RVR and had pneumonia and was subsequently started on Levaquin.  Initially, plan was for TEE/DCCV, however, it was recommended that this be done under mechanical ventilation and general anesthesia.  Patient clear he is a DNR and family requested palliative care discussion for consideration for hospice.  Recommendations/Plan: DNR/DNI Plan transition home with hospice support once equipment delivered. No further palliative specific recommendations at this time.  Please call if we can be of further assistance in care of Dalton Juarez moving forward.  Goals of Care and Additional Recommendations: Limitations on Scope of Treatment: Avoid Hospitalization  Code Status:    Code Status Orders  (From admission, onward)           Start     Ordered   09/27/20 1355  Do not attempt resuscitation (DNR)  Continuous       Question Answer Comment  In the event of cardiac or respiratory ARREST Do not call a "code blue"   In the event of cardiac or respiratory ARREST Do not perform Intubation, CPR, defibrillation or ACLS   In the event of cardiac or respiratory ARREST Use medication by any route, position, wound care,  and other measures to relive pain and suffering. May use oxygen, suction and manual treatment of airway obstruction as needed for comfort.      09/27/20 1354           Code Status History     Date Active Date Inactive Code Status Order ID Comments User  Context   09/17/2020 1548 09/27/2020 1354 Full Code [id number]  Jerald Kief, MD ED       Prognosis:  < 6 months  Discharge Planning: Home with Hospice  Care plan was discussed with patient, hospice liaison, Dr. Rhona Leavens, [facility]  Thank you for allowing the Palliative Medicine Team to assist in the care of this patient.   Time In: 1100 Time Out: 1125 Total Time 25 Prolonged Time Billed No  Greater than 50%  of this time was spent counseling and coordinating care related to the above assessment and plan.   Romie Minus, MD  Please contact Palliative Medicine Team phone at [phone] for questions and concerns.

## 2020-10-01 NOTE — Progress Notes (Signed)
CPT on hold due to pt eating.  

## 2020-10-01 NOTE — Telephone Encounter (Signed)
Pt still currently admitted.

## 2020-10-01 NOTE — Progress Notes (Signed)
Returned [employer] call. She stated he was having pain from hemorrhoids. I assured her I would check on him and see what we could do. She stated he had these problems for some time and he was able to push them back in at home. In the room after talking to patient I used the PRN Tucks, this seemed to help and the patient was able to sleep.

## 2020-10-01 NOTE — TOC Progression Note (Addendum)
Transition of Care [facility]) - Progression Note    Patient Details  Name: Dalton Juarez MRN: [id number] Date of Birth: 24-Apr-1945  [facility]) CM/SW Contact  Golda Acre, RN Phone Number: 10/01/2020, 9:38 AM  Clinical Narrative:    Tct-Chrislyn King/will check to see if [facility] can service the area of [place] and call back. Tcf-Chrislyn we do handle the area will call wife to set up hospital bed and 3 in 1.  Expected Discharge Plan: Home w Hospice Care Barriers to Discharge: No Barriers Identified  Expected Discharge Plan and Services Expected Discharge Plan: Home w Hospice Care   Discharge Planning Services: CM Consult Post Acute Care Choice: Hospice Living arrangements for the past 2 months: Single Family Home                                       Social Determinants of Health (SDOH) Interventions    Readmission Risk Interventions Readmission Risk Prevention Plan 09/30/2020  Transportation Screening Complete  Home Care Screening Complete  Medication Review (RN CM) Complete

## 2020-10-06 NOTE — Telephone Encounter (Signed)
Attempted to call patient- left message to call back  

## 2020-10-08 NOTE — Telephone Encounter (Signed)
I contacted to speak with patient, wife answered the phone and informed me that patient had passed away the night before last. I will remove this upcoming TOC appointment from Gillian Shields, NP's schedule.

## 2020-10-08 DEATH — deceased

## 2020-10-15 ENCOUNTER — Ambulatory Visit (HOSPITAL_BASED_OUTPATIENT_CLINIC_OR_DEPARTMENT_OTHER): Payer: Medicare Other | Admitting: Family

## 2022-05-06 IMAGING — DX DG CHEST 1V PORT
1 series · 1 of 1 positions shown · non-contrast
Comparison: Radiograph yesterday.

CLINICAL DATA: Pneumonia.  Increased shortness of breath.

EXAM:
PORTABLE CHEST 1 VIEW

[chest ap]
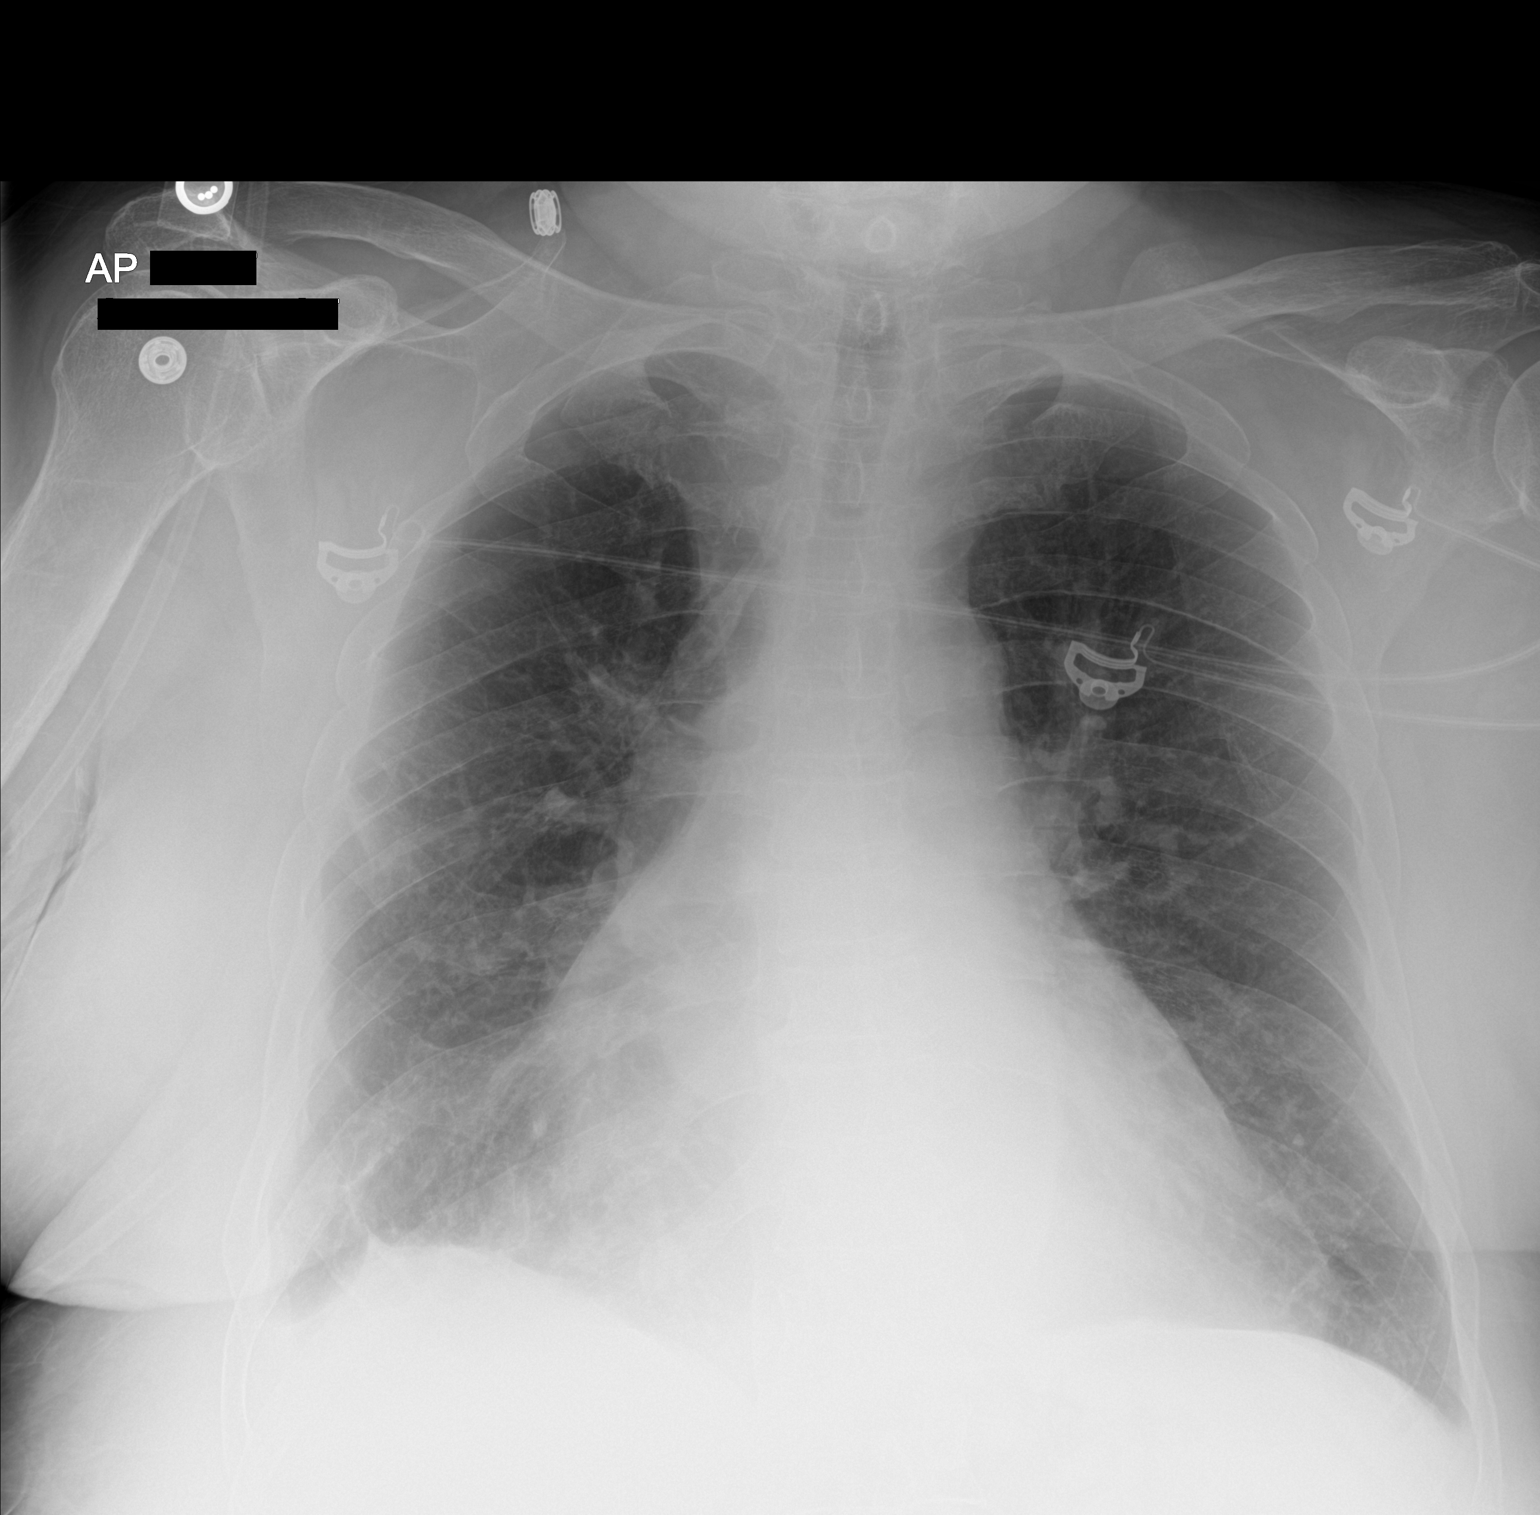

[1 of 1 positions shown; findings below may reference images not displayed]

FINDINGS: Unchanged cardiomegaly. Stable mediastinal contours. No significant
change in streaky opacity at the right lung base and blunting of the
costophrenic angle. No new airspace disease. There is mild
peribronchial thickening. No pneumothorax. Stable osseous
structures.
IMPRESSION: 1. No significant change in streaky opacity at the right lung base
and blunting of the costophrenic angle. Findings may represent
pneumonia or underlying scarring.
2. Stable cardiomegaly.
3. Peribronchial thickening may be bronchitic or congestive.

## 2022-05-12 IMAGING — CT CT HEAD CODE STROKE
3 of 4 series · 14 of 47 positions shown, 16 images · non-contrast
Comparison: None.

CLINICAL DATA: Code stroke. Acute onset left pupil change, right
facial droop

EXAM:
CT HEAD WITHOUT CONTRAST
TECHNIQUE: Contiguous axial images were obtained from the base of the skull
through the vertex without intravenous contrast.

[Series 3: head wo · axial · 0.47mm/px · z∈[+1370,+1495]mm · 8 of 31 slices shown, 10 images]
[im 3/31  brain]
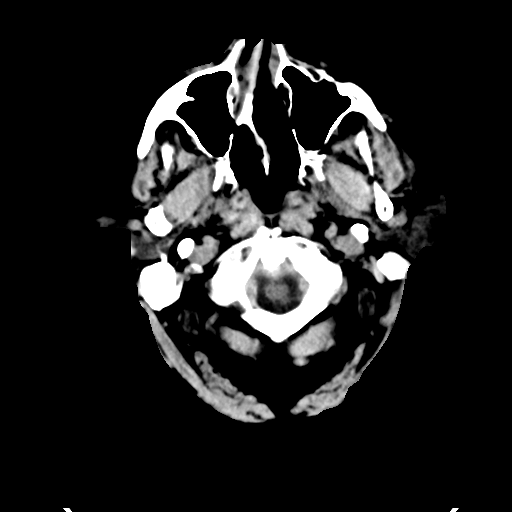
[im 3/31  bone]
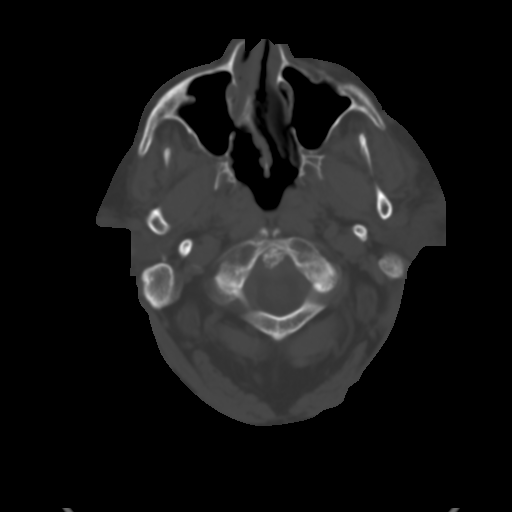
[im 7/31  brain]
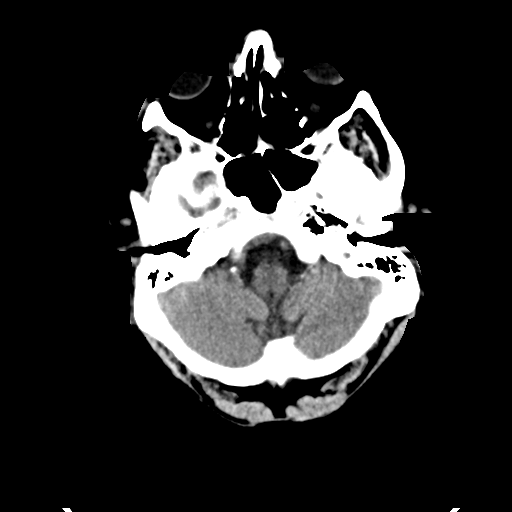
[im 11/31  brain]
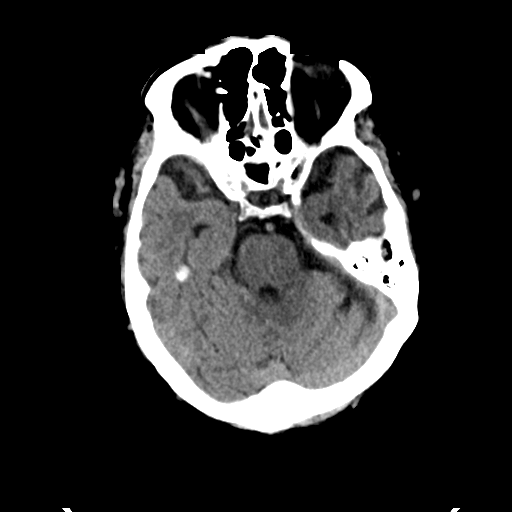
[im 13/31  brain]
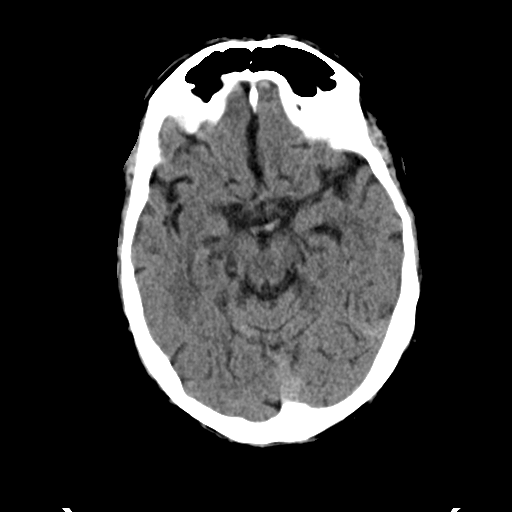
[im 18/31  brain]
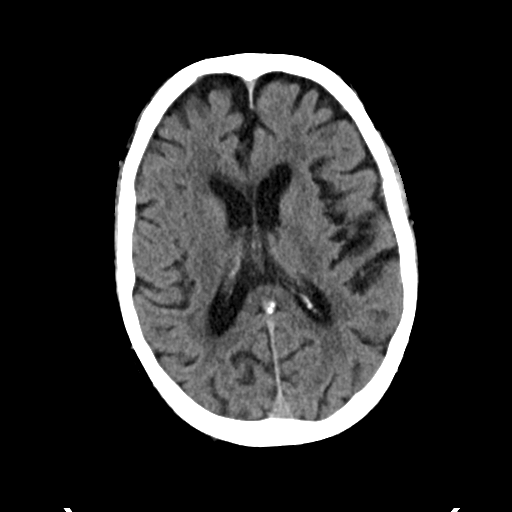
[im 18/31  bone]
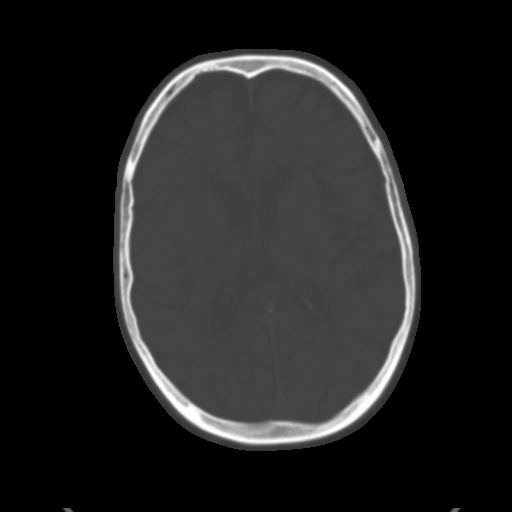
[im 20/31  brain]
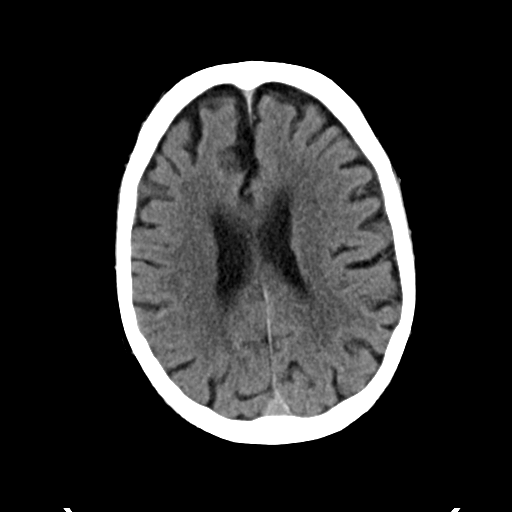
[im 24/31  brain]
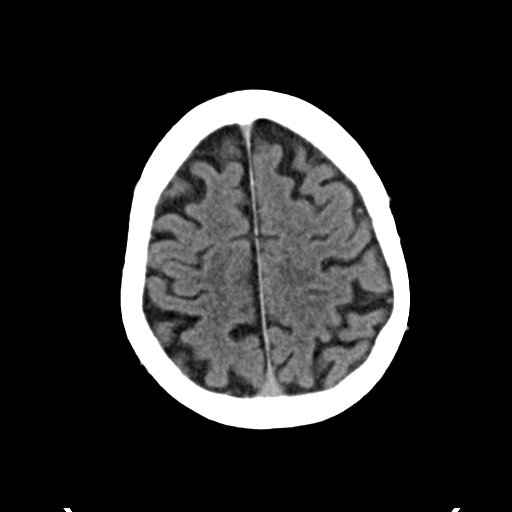
[im 28/31  brain]
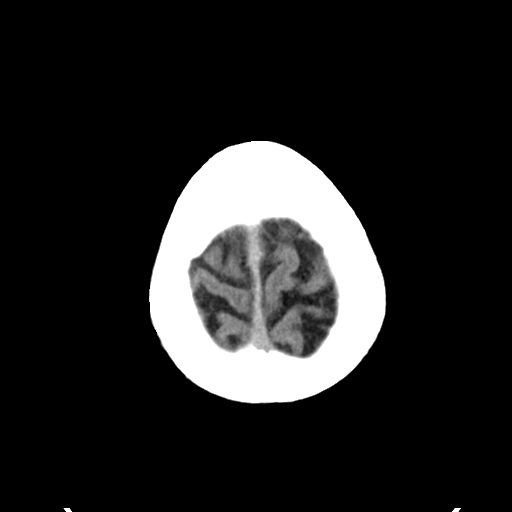

[Series 5: coronal soft tissue · coronal · 0.31mm/px · 3 of 70 slices shown]
[im 24/70  brain]
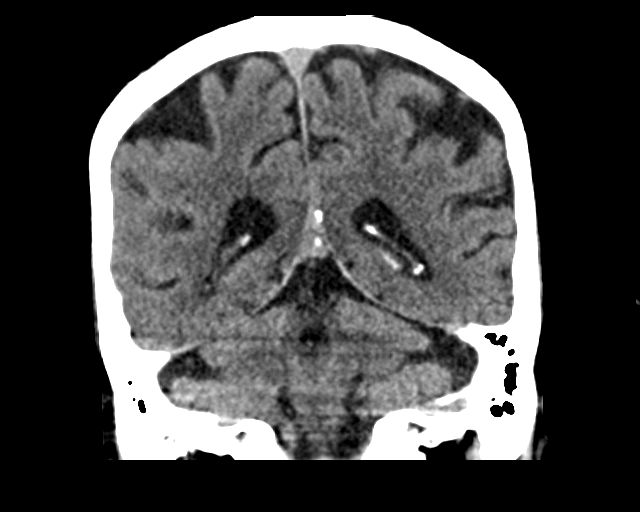
[im 31/70  brain]
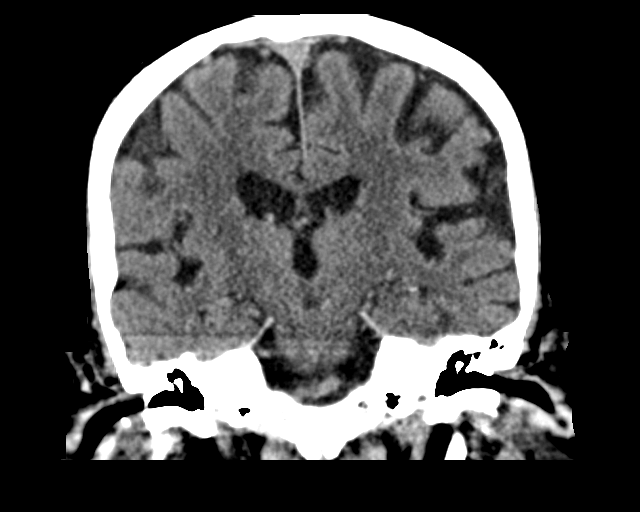
[im 39/70  brain]
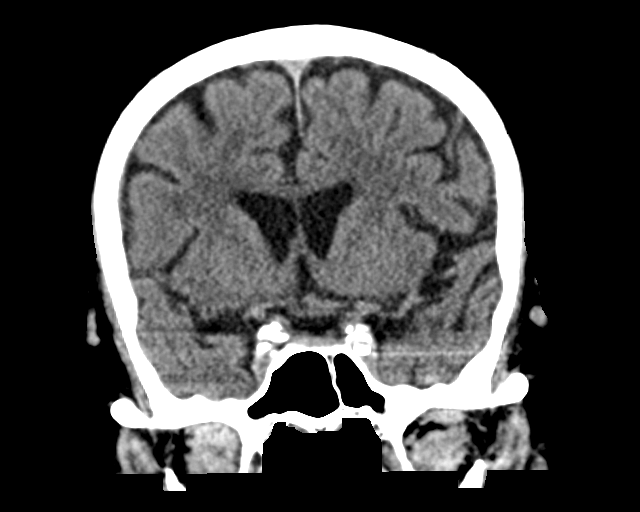

[Series 6: sagittal soft tissue · sagittal · 0.32mm/px · 3 of 56 slices shown]
[im 19/56  brain]
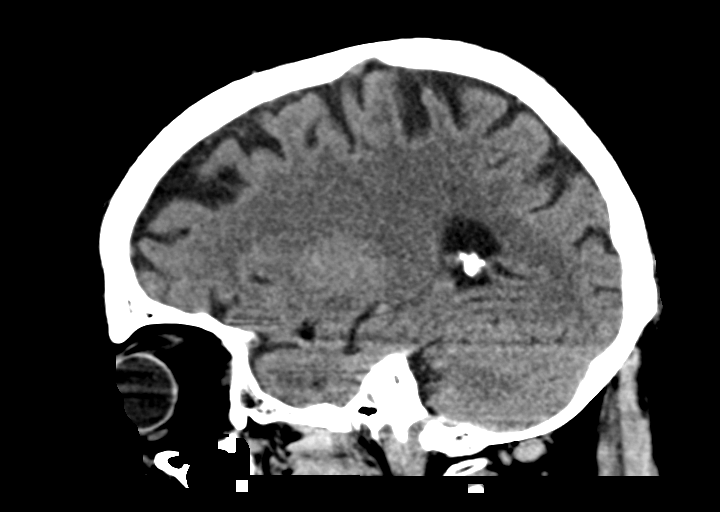
[im 28/56  brain]
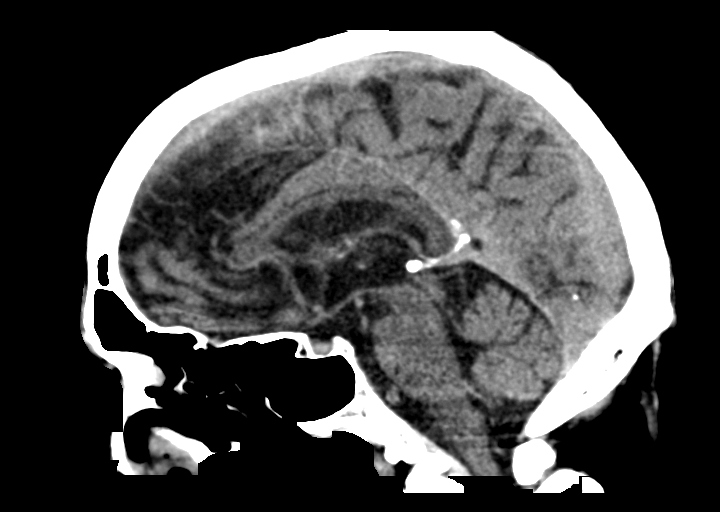
[im 37/56  brain]
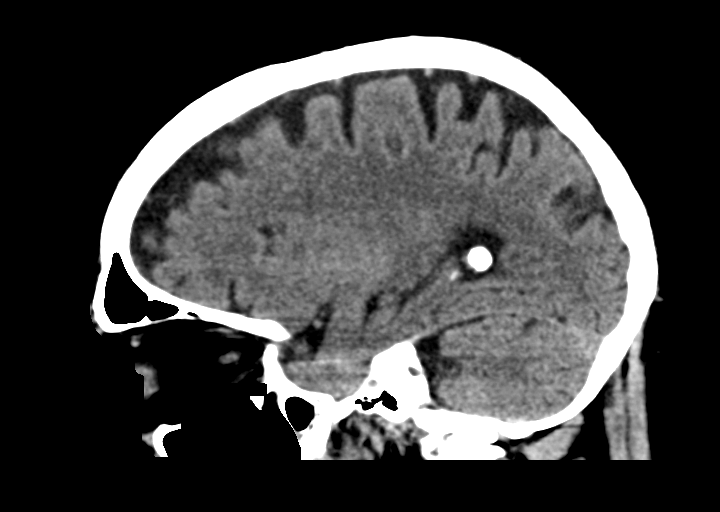

[14 of 47 positions shown; findings below may reference images not displayed]

FINDINGS: Brain: There is no evidence of acute intracranial hemorrhage,
extra-axial fluid collection, or infarct.

There is mild parenchymal volume loss with commensurate enlargement
of the ventricular system. Foci of hypodensity in the subcortical
and periventricular white matter likely reflect mild chronic white
matter microangiopathy. There is no mass lesion. There is no midline
shift.

Vascular: There is calcification of the bilateral cavernous ICAs.

Skull: Normal. Negative for fracture or focal lesion.

Sinuses/Orbits: The imaged paranasal sinuses are clear. The patient
is status post right lens implant. The globes and orbits are
otherwise unremarkable.

Other: There is a remote right nasal bone fracture.

ASPECTS (Alberta Stroke Program Early CT Score)

- Ganglionic level infarction (caudate, lentiform nuclei, internal
capsule, insula, M1-M3 cortex): 3

- Supraganglionic infarction (M4-M6 cortex): 7

Total score (0-10 with 10 being normal): 10
IMPRESSION: 1. No acute intracranial pathology
2. ASPECTS is 10
3. Mild parenchymal volume loss and chronic white matter
microangiopathy.

These results were called by telephone at the time of interpretation
on 09/25/2020 at [DATE] to provider FARYAN BAYANI , who verbally
acknowledged these results.
